# Patient Record
Sex: Female | Born: 1940 | Hispanic: No
Health system: Southern US, Community
[De-identification: ages and names within clinical notes are randomized; demographics above are authoritative.]

## PROBLEM LIST (undated history)

## (undated) DIAGNOSIS — I10 Essential (primary) hypertension: Secondary | ICD-10-CM

## (undated) DIAGNOSIS — E119 Type 2 diabetes mellitus without complications: Secondary | ICD-10-CM

## (undated) DIAGNOSIS — K56609 Unspecified intestinal obstruction, unspecified as to partial versus complete obstruction: Secondary | ICD-10-CM

## (undated) HISTORY — PX: ABDOMINAL SURGERY: SHX537

---

## 2018-07-18 ENCOUNTER — Emergency Department (HOSPITAL_BASED_OUTPATIENT_CLINIC_OR_DEPARTMENT_OTHER): Payer: Self-pay

## 2018-07-18 ENCOUNTER — Inpatient Hospital Stay (HOSPITAL_BASED_OUTPATIENT_CLINIC_OR_DEPARTMENT_OTHER): Payer: Self-pay

## 2018-07-18 ENCOUNTER — Other Ambulatory Visit: Payer: Self-pay

## 2018-07-18 ENCOUNTER — Inpatient Hospital Stay (HOSPITAL_COMMUNITY): Payer: Self-pay

## 2018-07-18 ENCOUNTER — Inpatient Hospital Stay (HOSPITAL_BASED_OUTPATIENT_CLINIC_OR_DEPARTMENT_OTHER)
Admission: EM | Admit: 2018-07-18 | Discharge: 2018-07-23 | DRG: 390 | Disposition: A | Discharge status: Home / Self Care | Payer: Self-pay | Attending: Internal Medicine | Admitting: Internal Medicine

## 2018-07-18 ENCOUNTER — Encounter (HOSPITAL_BASED_OUTPATIENT_CLINIC_OR_DEPARTMENT_OTHER): Payer: Self-pay

## 2018-07-18 DIAGNOSIS — H04123 Dry eye syndrome of bilateral lacrimal glands: Secondary | ICD-10-CM | POA: Diagnosis present

## 2018-07-18 DIAGNOSIS — J309 Allergic rhinitis, unspecified: Secondary | ICD-10-CM | POA: Diagnosis present

## 2018-07-18 DIAGNOSIS — E876 Hypokalemia: Secondary | ICD-10-CM | POA: Diagnosis present

## 2018-07-18 DIAGNOSIS — Z992 Dependence on renal dialysis: Secondary | ICD-10-CM

## 2018-07-18 DIAGNOSIS — I10 Essential (primary) hypertension: Secondary | ICD-10-CM | POA: Diagnosis present

## 2018-07-18 DIAGNOSIS — E119 Type 2 diabetes mellitus without complications: Secondary | ICD-10-CM | POA: Diagnosis present

## 2018-07-18 DIAGNOSIS — Z7722 Contact with and (suspected) exposure to environmental tobacco smoke (acute) (chronic): Secondary | ICD-10-CM | POA: Diagnosis present

## 2018-07-18 DIAGNOSIS — Z23 Encounter for immunization: Secondary | ICD-10-CM

## 2018-07-18 DIAGNOSIS — Z0189 Encounter for other specified special examinations: Secondary | ICD-10-CM

## 2018-07-18 DIAGNOSIS — K56609 Unspecified intestinal obstruction, unspecified as to partial versus complete obstruction: Principal | ICD-10-CM | POA: Diagnosis present

## 2018-07-18 DIAGNOSIS — Z79899 Other long term (current) drug therapy: Secondary | ICD-10-CM

## 2018-07-18 DIAGNOSIS — E1122 Type 2 diabetes mellitus with diabetic chronic kidney disease: Secondary | ICD-10-CM | POA: Diagnosis present

## 2018-07-18 DIAGNOSIS — E869 Volume depletion, unspecified: Secondary | ICD-10-CM | POA: Diagnosis present

## 2018-07-18 DIAGNOSIS — N186 End stage renal disease: Secondary | ICD-10-CM

## 2018-07-18 HISTORY — DX: Type 2 diabetes mellitus without complications: E11.9

## 2018-07-18 HISTORY — DX: Unspecified intestinal obstruction, unspecified as to partial versus complete obstruction: K56.609

## 2018-07-18 HISTORY — DX: Essential (primary) hypertension: I10

## 2018-07-18 LAB — CBC WITH DIFFERENTIAL/PLATELET
Abs Immature Granulocytes: 0.02 10*3/uL (ref 0.00–0.07)
Basophils Absolute: 0 10*3/uL (ref 0.0–0.1)
Basophils Relative: 0 %
Eosinophils Absolute: 0.1 10*3/uL (ref 0.0–0.5)
Eosinophils Relative: 1 %
HCT: 42.6 % (ref 36.0–46.0)
Hemoglobin: 13.9 g/dL (ref 12.0–15.0)
Immature Granulocytes: 0 %
Lymphocytes Relative: 16 %
Lymphs Abs: 1.3 10*3/uL (ref 0.7–4.0)
MCH: 28.4 pg (ref 26.0–34.0)
MCHC: 32.6 g/dL (ref 30.0–36.0)
MCV: 86.9 fL (ref 80.0–100.0)
Monocytes Absolute: 0.7 10*3/uL (ref 0.1–1.0)
Monocytes Relative: 9 %
Neutro Abs: 5.9 10*3/uL (ref 1.7–7.7)
Neutrophils Relative %: 74 %
Platelets: 235 10*3/uL (ref 150–400)
RBC: 4.9 MIL/uL (ref 3.87–5.11)
RDW: 13 % (ref 11.5–15.5)
WBC: 8 10*3/uL (ref 4.0–10.5)
nRBC: 0 % (ref 0.0–0.2)

## 2018-07-18 LAB — COMPREHENSIVE METABOLIC PANEL
ALT: 14 U/L (ref 0–44)
AST: 19 U/L (ref 15–41)
Albumin: 4 g/dL (ref 3.5–5.0)
Alkaline Phosphatase: 53 U/L (ref 38–126)
Anion gap: 12 (ref 5–15)
BUN: 21 mg/dL (ref 8–23)
CO2: 27 mmol/L (ref 22–32)
Calcium: 9.2 mg/dL (ref 8.9–10.3)
Chloride: 95 mmol/L — ABNORMAL LOW (ref 98–111)
Creatinine, Ser: 0.7 mg/dL (ref 0.44–1.00)
GFR calc Af Amer: >60 mL/min (ref >60)
GFR calc non Af Amer: >60 mL/min (ref >60)
Glucose, Bld: 148 mg/dL — ABNORMAL HIGH (ref 70–99)
Potassium: 3.7 mmol/L (ref 3.5–5.1)
Sodium: 134 mmol/L — ABNORMAL LOW (ref 135–145)
Total Bilirubin: 1.5 mg/dL — ABNORMAL HIGH (ref 0.3–1.2)
Total Protein: 7.4 g/dL (ref 6.5–8.1)

## 2018-07-18 LAB — CBC
HCT: 42.9 % (ref 36.0–46.0)
Hemoglobin: 13.7 g/dL (ref 12.0–15.0)
MCH: 27.3 pg (ref 26.0–34.0)
MCHC: 31.9 g/dL (ref 30.0–36.0)
MCV: 85.6 fL (ref 80.0–100.0)
Platelets: 233 10*3/uL (ref 150–400)
RBC: 5.01 MIL/uL (ref 3.87–5.11)
RDW: 13 % (ref 11.5–15.5)
WBC: 7.3 10*3/uL (ref 4.0–10.5)
nRBC: 0 % (ref 0.0–0.2)

## 2018-07-18 LAB — URINALYSIS, MICROSCOPIC (REFLEX)

## 2018-07-18 LAB — URINALYSIS, ROUTINE W REFLEX MICROSCOPIC
Bilirubin Urine: NEGATIVE
Glucose, UA: NEGATIVE mg/dL
Ketones, ur: 15 mg/dL — AB
Nitrite: NEGATIVE
Protein, ur: NEGATIVE mg/dL
Specific Gravity, Urine: <1.005 — ABNORMAL LOW (ref 1.005–1.030)
pH: 6 (ref 5.0–8.0)

## 2018-07-18 LAB — CREATININE, SERUM
Creatinine, Ser: 0.77 mg/dL (ref 0.44–1.00)
GFR calc Af Amer: >60 mL/min (ref >60)
GFR calc non Af Amer: >60 mL/min (ref >60)

## 2018-07-18 LAB — LIPASE, BLOOD: Lipase: 21 U/L (ref 11–51)

## 2018-07-18 MED ORDER — CLONIDINE HCL 0.1 MG/24HR TD PTWK
0.1000 mg | MEDICATED_PATCH | TRANSDERMAL | Status: DC
  Administered 2018-07-18: 0.1 mg via TRANSDERMAL
  Filled 2018-07-18: qty 1

## 2018-07-18 MED ORDER — HEPARIN SODIUM (PORCINE) 5000 UNIT/ML IJ SOLN
5000.0000 [IU] | Freq: Three times a day (TID) | INTRAMUSCULAR | Status: DC
  Administered 2018-07-18 – 2018-07-23 (×14): 5000 [IU] via SUBCUTANEOUS
  Filled 2018-07-18 (×14): qty 1

## 2018-07-18 MED ORDER — SODIUM CHLORIDE 0.9 % IV BOLUS
500.0000 mL | Freq: Once | INTRAVENOUS | Status: AC
  Administered 2018-07-18: 500 mL via INTRAVENOUS

## 2018-07-18 MED ORDER — METOPROLOL TARTRATE 5 MG/5ML IV SOLN: 5.0000 mg | Freq: Four times a day (QID) | INTRAVENOUS | Status: DC | PRN

## 2018-07-18 MED ORDER — LIDOCAINE HCL URETHRAL/MUCOSAL 2 % EX GEL
1.0000 "application " | Freq: Once | CUTANEOUS | Status: AC
  Administered 2018-07-18: 13:00:00 1

## 2018-07-18 MED ORDER — LIDOCAINE HCL URETHRAL/MUCOSAL 2 % EX GEL
CUTANEOUS | Status: AC
  Administered 2018-07-18: 1
  Filled 2018-07-18: qty 20

## 2018-07-18 MED ORDER — DIPHENHYDRAMINE HCL 50 MG/ML IJ SOLN
12.5000 mg | Freq: Once | INTRAMUSCULAR | Status: AC
  Administered 2018-07-18: 12.5 mg via INTRAVENOUS
  Filled 2018-07-18: qty 1

## 2018-07-18 MED ORDER — IOHEXOL 300 MG/ML  SOLN
100.0000 mL | Freq: Once | INTRAMUSCULAR | Status: AC | PRN
  Administered 2018-07-18: 100 mL via INTRAVENOUS

## 2018-07-18 MED ORDER — DIATRIZOATE MEGLUMINE & SODIUM 66-10 % PO SOLN
90.0000 mL | Freq: Once | ORAL | Status: AC
  Administered 2018-07-18: 90 mL via NASOGASTRIC
  Filled 2018-07-18: qty 90

## 2018-07-18 NOTE — H&P (Signed)
History and Physical  Erin Burke [facility]:[id number] DOB: [birth year] DOA: 07/18/2018  Referring physician: ER physician PCP: Patient, No Pcp Per  Outpatient Specialists:    Patient coming from: [facility] to the ER at [place] Complaint: Nausea, vomiting and abdominal pain.    HPI: Patient is a 78 year old female past medical history significant for diabetes mellitus, hypertension and prior history of small bowel obstruction about 2 years ago for which the patient underwent surgery.  Patient presents with abdominal pain that started 3 days ago, gradually worsening, with associated nausea and vomiting.  Last bowel movement was 4 days ago, and no flatus.  CT scan of the abdomen and pelvis done with contrast revealed small bowel obstruction with a transition point in the ventral abdomen.  No headache, no neck pain, no chest pain, no shortness of breath, no fevers chills, no urinary symptoms and no joint pain.  Patient be admitted for further assessment and management.  ED Course: Patient was seen in the ER, as CT revealed likely small bowel obstruction.  NG to low suction.  Surgical team was consulted by the ER team.  Pertinent labs: Chemistry reveals sodium of 134, potassium of 3.7, chloride 95, CO2 27, BUN of 21 with creatinine of 0.7.  CBC reveals WBC of 7.3, hemoglobin of 13.7, hematocrit of 40.9, MCV of 85.0 platelet count of 233.  Imaging: independently reviewed.   Review of Systems: Negative for fever, visual changes, sore throat, rash, new muscle aches, chest pain, SOB, dysuria.  Past Medical History:  Diagnosis Date  . Diabetes mellitus without complication ([place])   . Hypertension   . Small bowel obstruction [facility])     Past Surgical History:  Procedure Laterality Date  . ABDOMINAL SURGERY       reports that she is a non-smoker but has been exposed to tobacco smoke. She has never used smokeless tobacco. She reports that she does not drink alcohol or use drugs.   Allergies no known allergies  No family history on file.   Prior to Admission medications   Medication Sig Start Date End Date Taking? Authorizing Provider  atenolol (TENORMIN) 50 MG tablet Take 25 mg by mouth.   Yes [provider]  NON FORMULARY Take 50 mg by mouth 1 day or 1 dose. vildagliptin anti-hyperglycemic agent   Yes [provider]    Physical Exam: Vitals:   07/18/18 1600 07/18/18 1630 07/18/18 1700 07/18/18 1828  BP: (!) 143/68 113/87 132/63 (!) 151/73  Pulse: 96 92 90 98  Resp:   16 18  Temp:    (!) 97.5 F (36.4 C)  TempSrc:    Oral  SpO2: 95% 95% 93% 93%  Weight:    73.6 kg  Height:    5\' 1"  (1.549 m)     Constitutional:  . Appears calm and comfortable.  NG tube in situ. Eyes:  . No pallor. No jaundice.  ENMT:  . external ears, nose appear normal Neck:  . Neck is supple. No JVD Respiratory:  . CTA bilaterally, no w/r/r.  . Respiratory effort normal. No retractions or accessory muscle use Cardiovascular:  . S1S2 . No LE extremity edema   Abdomen:  . Abdomen is soft and non tender. Organs are difficult to assess. Neurologic:  . Awake and alert. . Moves all limbs.  Wt Readings from Last 3 Encounters:  07/18/18 73.6 kg    I have personally reviewed following labs and imaging studies  Labs on Admission:  CBC:  Recent Labs  Lab 07/18/18 1025  WBC 8.0  NEUTROABS 5.9  HGB 13.9  HCT 42.6  MCV 86.9  PLT 563   Basic Metabolic Panel: Recent Labs  Lab 07/18/18 1025  NA 134*  K 3.7  CL 95*  CO2 27  GLUCOSE 148*  BUN 21  CREATININE 0.70  CALCIUM 9.2   Liver Function Tests: Recent Labs  Lab 07/18/18 1025  AST 19  ALT 14  ALKPHOS 53  BILITOT 1.5*  PROT 7.4  ALBUMIN 4.0   Recent Labs  Lab 07/18/18 1025  LIPASE 21   No results for input(s): AMMONIA in the last 168 hours. Coagulation Profile: No results for input(s): INR, PROTIME in the last 168 hours. Cardiac Enzymes: No results for input(s): CKTOTAL,  CKMB, CKMBINDEX, TROPONINI in the last 168 hours. BNP (last 3 results) No results for input(s): PROBNP in the last 8760 hours. HbA1C: No results for input(s): HGBA1C in the last 72 hours. CBG: No results for input(s): GLUCAP in the last 168 hours. Lipid Profile: No results for input(s): CHOL, HDL, LDLCALC, TRIG, CHOLHDL, LDLDIRECT in the last 72 hours. Thyroid Function Tests: No results for input(s): TSH, T4TOTAL, FREET4, T3FREE, THYROIDAB in the last 72 hours. Anemia Panel: No results for input(s): VITAMINB12, FOLATE, FERRITIN, TIBC, IRON, RETICCTPCT in the last 72 hours. Urine analysis:    Component Value Date/Time   COLORURINE YELLOW 07/18/2018 1248   APPEARANCEUR CLEAR 07/18/2018 1248   LABSPEC <1.005 (L) 07/18/2018 1248   PHURINE 6.0 07/18/2018 1248   GLUCOSEU NEGATIVE 07/18/2018 1248   HGBUR TRACE (A) 07/18/2018 1248   BILIRUBINUR NEGATIVE 07/18/2018 1248   KETONESUR 15 (A) 07/18/2018 1248   PROTEINUR NEGATIVE 07/18/2018 1248   NITRITE NEGATIVE 07/18/2018 1248   LEUKOCYTESUR TRACE (A) 07/18/2018 1248   Sepsis Labs: @LABRCNTIP (procalcitonin:4,lacticidven:4) )No results found for this or any previous visit (from the past 240 hour(s)).    Radiological Exams on Admission: Ct Abdomen Pelvis W Contrast  Result Date: 07/18/2018 CLINICAL DATA:  Bowel obstruction, high-grade. EXAM: CT ABDOMEN AND PELVIS WITH CONTRAST TECHNIQUE: Multidetector CT imaging of the abdomen and pelvis was performed using the standard protocol following bolus administration of intravenous contrast. CONTRAST:  150mL OMNIPAQUE IOHEXOL 300 MG/ML  SOLN COMPARISON:  None. FINDINGS: Lower chest: Mild dependent atelectasis is present at the lung bases bilaterally. The heart size is normal. No significant pleural or pericardial effusion is present. Coronary artery calcifications are present. Hepatobiliary: Mild intrahepatic biliary dilation is present. A cyst is present posteriorly in the right lobe of the liver  measuring 10 mm. Common bile duct is within normal limits. No other parenchymal lesions are present. The gallbladder is normal. Pancreas: Unremarkable. No pancreatic ductal dilatation or surrounding inflammatory changes. Spleen: Normal in size without focal abnormality. Adrenals/Urinary Tract: Adrenal glands are normal bilaterally. Kidneys and ureters are within normal limits. The urinary bladder is normal. Stomach/Bowel: The stomach and duodenum are within normal limits. The proximal small bowel is moderately dilated. There is a transition point just left of midline in the ventral abdomen. No focal mass lesion is present. There is fecalized material at the transition point. There is also some free fluid. No free air is present. The more distal small bowel is decompressed. The ascending and transverse colon are normal. The descending and sigmoid colon are normal. Vascular/Lymphatic: Atherosclerotic calcifications are present in the aorta and branch vessels without aneurysm. No significant adenopathy is present. Reproductive: Uterus and bilateral adnexa are unremarkable. Other: No other free fluid or free  air is present. There is no ventral hernia. Musculoskeletal: Grade 1 anterolisthesis is present at L4-5. AP alignment is otherwise anatomic. A vacuum disc is present at L5-S1. IMPRESSION: 1. Small bowel obstruction with a transition point in the ventral abdomen. No mass lesion is present. This may be due to adhesions. 2. Fluid adjacent to the transition point is likely inflammatory. 3. Mild intrahepatic biliary dilation without an obstructing lesion. This may be within normal limits. 4. Degenerative changes in the lower lumbar spine. Electronically Signed   By: San Morelle M.D.   On: 07/18/2018 12:13   Dg Chest Portable 1 View  Result Date: 07/18/2018 CLINICAL DATA:  Nasogastric placement for small-bowel obstruction. EXAM: PORTABLE CHEST 1 VIEW COMPARISON:  CT same day FINDINGS: Nasogastric tube enters  the stomach with its tip in the fundus. Dilated fluid and air-filled loops of small bowel are partially depicted. Active cardiopulmonary disease. Heart size is normal. Lungs are clear. IMPRESSION: Nasogastric tube enters the stomach with its tip in the fundus. Electronically Signed   By: Nelson Chimes M.D.   On: 07/18/2018 13:41    Active Problems:   SBO (small bowel obstruction) (HCC)   Assessment/Plan Small bowel obstruction: NG tube is placed to low suction. Supportive care for now. Surgery team is following.  Nausea and vomiting: Likely secondary to above. Manage expectantly.  Volume depletion: Continue IV fluids Supportive care  Hypertension: Continue to optimize.  Diabetes mellitus: Continue to optimize.  DVT prophylaxis:Subacute heparin Code Status: Full code Family Communication:  Disposition Plan: Home eventually Consults called: ER team has already consulted surgical history Admission status: Inpatient  Time spent: 45 minutes.  Dana Allan, MD  [facility] Pager #: [phone] 7PM-7AM contact night coverage as above  07/18/2018, 6:48 PM

## 2018-07-18 NOTE — ED Notes (Signed)
Bedside CXR to confirm NGT plct. NG placed to low intermittent suction, green biliary fluid out.

## 2018-07-18 NOTE — ED Triage Notes (Addendum)
Pt afebrile, from [place], came to [place] in Dec to visit family though unable to return home at this time.   Pt c/o abd pain 3 days ago. Last BM 4 days, "very little". No flatus x3 days.  Nausea yesterday x1, then again overnight. Pt induced vomiting with only liquid emesis. Last solid food Wed morning, few fluids past few days.  No pain/nausea meds given at home.  Yesterday pt given dulcolax, Miralax, suppository, and enema all at home with no results.   Occasional constipation, hx obstruction and surgical intervention (back home in [place]).

## 2018-07-18 NOTE — ED Notes (Signed)
First contact with patient; self and role introduced.  Daughter at bedside with pt. Pt placed on BP monitor for ongoing assessment.   Previous documentation noted. Patient agrees. MD in room to assess pt using audio interpretor. Will await orders.   Bed is in low, locked position. Call light within reach.  All needs and concerns addressed prior to leaving room.

## 2018-07-18 NOTE — ED Provider Notes (Signed)
Emergency Department Provider Note   I have reviewed the triage vital signs and the nursing notes.   HISTORY  Chief Complaint Abdominal Pain and Nausea   HPI Jacaria Colburn is a 78 y.o. female from [place] with [place] of prior SBO with surgery in [place], HTN, and DM presents to the emergency department with abdominal pain with nausea vomiting and decreased stool/flatus output.  Symptoms have been worsening over the past 4 days.  Patient is here visiting her daughter and has been unable to return to [place] because of COVID-19.  The patient states she has had worsening symptoms over the past 4 days.  She has nausea with eating any foods.  She has been making herself vomit to help her feel better temporarily.  She denies any blood in the vomitus.  4 days ago the patient felt some constipation type symptoms.  Her daughter tried an enema with no relief.  She had a lot of flatus several days ago but that has decreased and has not passed any stool or gas in the past 24 hours.  She denies any fevers.  No respiratory symptoms.  No past medical history on file.  Patient Active Problem List   Diagnosis Date Noted  . SBO (small bowel obstruction) ([place]) 07/18/2018   Allergies Patient has no known allergies.  No family history on file.  Social History Social History   Tobacco Use  . Smoking status: Not on file  Substance Use Topics  . Alcohol use: Not on file  . Drug use: Not on file    Review of Systems  Constitutional: No fever/chills Eyes: No visual changes. ENT: No sore throat. Cardiovascular: Denies chest pain. Respiratory: Denies shortness of breath. Gastrointestinal: Positive abdominal pain. Positive nausea and vomiting.  No diarrhea. Positive constipation. Genitourinary: Negative for dysuria. Musculoskeletal: Negative for back pain. Skin: Negative for rash. Neurological: Negative for headaches, focal weakness or numbness.  10-point ROS otherwise negative.   ____________________________________________   PHYSICAL EXAM:  VITAL SIGNS: ED Triage Vitals  Enc Vitals Group     BP 07/18/18 0955 134/76     Pulse Rate 07/18/18 0955 97     Resp --      Temp 07/18/18 0955 98.1 F (36.7 C)     Temp Source 07/18/18 0955 Oral     SpO2 07/18/18 0955 97 %     Weight 07/18/18 0957 163 lb (73.9 kg)     Height 07/18/18 0957 5\' 1"  (1.549 m)     Pain Score 07/18/18 0956 6   Constitutional: Alert and oriented. Well appearing and in no acute distress. Eyes: Conjunctivae are normal. Head: Atraumatic. Nose: No congestion/rhinnorhea. Mouth/Throat: Mucous membranes are moist.  Neck: No stridor.  Cardiovascular: Normal rate, regular rhythm. Good peripheral circulation. Grossly normal heart sounds.   Respiratory: Normal respiratory effort.  No retractions. Lungs CTAB. Gastrointestinal: Soft with mild diffuse tenderness. No focal tenderness, rebound, or guarding. Mild distention.  Musculoskeletal: No lower extremity tenderness nor edema. No gross deformities of extremities. Neurologic:  Normal speech and language. No gross focal neurologic deficits are appreciated.  Skin:  Skin is warm, dry and intact. No rash noted.  ____________________________________________   LABS (all labs ordered are listed, but only abnormal results are displayed)  Labs Reviewed  COMPREHENSIVE METABOLIC PANEL - Abnormal; Notable for the following components:      Result Value   Sodium 134 (*)    Chloride 95 (*)    Glucose, Bld 148 (*)    Total  Bilirubin 1.5 (*)    All other components within normal limits  URINE CULTURE  LIPASE, BLOOD  CBC WITH DIFFERENTIAL/PLATELET  URINALYSIS, ROUTINE W REFLEX MICROSCOPIC   ____________________________________________  RADIOLOGY  Ct Abdomen Pelvis W Contrast  Result Date: 07/18/2018 CLINICAL DATA:  Bowel obstruction, high-grade. EXAM: CT ABDOMEN AND PELVIS WITH CONTRAST TECHNIQUE: Multidetector CT imaging of the abdomen and pelvis  was performed using the standard protocol following bolus administration of intravenous contrast. CONTRAST:  18mL OMNIPAQUE IOHEXOL 300 MG/ML  SOLN COMPARISON:  None. FINDINGS: Lower chest: Mild dependent atelectasis is present at the lung bases bilaterally. The heart size is normal. No significant pleural or pericardial effusion is present. Coronary artery calcifications are present. Hepatobiliary: Mild intrahepatic biliary dilation is present. A cyst is present posteriorly in the right lobe of the liver measuring 10 mm. Common bile duct is within normal limits. No other parenchymal lesions are present. The gallbladder is normal. Pancreas: Unremarkable. No pancreatic ductal dilatation or surrounding inflammatory changes. Spleen: Normal in size without focal abnormality. Adrenals/Urinary Tract: Adrenal glands are normal bilaterally. Kidneys and ureters are within normal limits. The urinary bladder is normal. Stomach/Bowel: The stomach and duodenum are within normal limits. The proximal small bowel is moderately dilated. There is a transition point just left of midline in the ventral abdomen. No focal mass lesion is present. There is fecalized material at the transition point. There is also some free fluid. No free air is present. The more distal small bowel is decompressed. The ascending and transverse colon are normal. The descending and sigmoid colon are normal. Vascular/Lymphatic: Atherosclerotic calcifications are present in the aorta and branch vessels without aneurysm. No significant adenopathy is present. Reproductive: Uterus and bilateral adnexa are unremarkable. Other: No other free fluid or free air is present. There is no ventral hernia. Musculoskeletal: Grade 1 anterolisthesis is present at L4-5. AP alignment is otherwise anatomic. A vacuum disc is present at L5-S1. IMPRESSION: 1. Small bowel obstruction with a transition point in the ventral abdomen. No mass lesion is present. This may be due to  adhesions. 2. Fluid adjacent to the transition point is likely inflammatory. 3. Mild intrahepatic biliary dilation without an obstructing lesion. This may be within normal limits. 4. Degenerative changes in the lower lumbar spine. Electronically Signed   By: San Morelle M.D.   On: 07/18/2018 12:13    ____________________________________________   PROCEDURES  Procedure(s) performed:   Procedures  None  ____________________________________________   INITIAL IMPRESSION / ASSESSMENT AND PLAN / ED COURSE  Pertinent labs & imaging results that were available during my care of the patient were reviewed by me and considered in my medical decision making (see chart for details).   Patient presents to the emergency department for evaluation of abdominal pain with mild distention, nausea, vomiting, constipation.  She has a history of bowel obstruction and my clinical suspicion for this is elevated at this time.  No fever.  No respiratory symptoms.  Patient not requesting any pain or nausea medicine at this time.  Vital signs are unremarkable.  Low suspicion for bowel ischemia clinically.  Plan for screening labs, CT, reassess. No symptoms to suspect COVID infection.   CT imaging reviewed which shows evidence of small bowel obstruction with transition point in the ventral abdomen.  No other complicating factors on CT.  Lab work reviewed.  Discussed the results with the patient and her daughter at bedside.  NG tube placed intermittent suction.  Discussed the case with Dr. Rosendo Gros with general  surgery.  He plans to consult on the patient and requests hospitalist admission.   Discussed patient's case with Hospitalist, Dr. Derryl Harbor to request admission. Patient and family (if present) updated with plan. Care transferred to Hospitalist service.  I was asked by the hospitalist to place temporary admission orders.  Agrees with inpatient, MedSurg bed.  Orders placed.   I reviewed all nursing notes,  vitals, pertinent old records, EKGs, labs, imaging (as available).  ____________________________________________  FINAL CLINICAL IMPRESSION(S) / ED DIAGNOSES  Final diagnoses:  SBO (small bowel obstruction) (HCC)     MEDICATIONS GIVEN DURING THIS VISIT:  Medications  sodium chloride 0.9 % bolus 500 mL (0 mLs Intravenous Stopped 07/18/18 1312)  iohexol (OMNIPAQUE) 300 MG/ML solution 100 mL (100 mLs Intravenous Contrast Given 07/18/18 1142)  lidocaine (XYLOCAINE) 2 % jelly 1 application (1 application Other Given 07/18/18 1251)    Note:  This document was prepared using Dragon voice recognition software and may include unintentional dictation errors.  Nanda Quinton, MD Emergency Medicine    Jasani Lengel, [facility], MD 07/18/18 [phone]

## 2018-07-18 NOTE — Consult Note (Signed)
Reason for Consult: Small bowel obstruction Referring Physician: Marthenia Rolling MD   Erin Burke is an 78 y.o. female.  HPI: Asked see the patient at the request of Dr.ogbata due to small bowel obstruction.  The patient has a 4-day history of nausea, vomiting and constipation.  She has had pain throughout her abdomen during those 4 days but today it is gotten worse.  She has had flatus up until yesterday.  CT scan shows high-grade mid to distal small bowel obstruction.  She has a history of small bowel obstruction back in [place] where she is from her.  Her daughters at the bedside to help communicate with her.  She feels better with the NG tube in for now.  There is been no blood in her emesis nor stool.  Past Medical History:  Diagnosis Date  . Diabetes mellitus without complication ([place])   . Hypertension   . Small bowel obstruction [facility])     Past Surgical History:  Procedure Laterality Date  . ABDOMINAL SURGERY      No family history on file.  Social History:  reports that she is a non-smoker but has been exposed to tobacco smoke. She has never used smokeless tobacco. She reports that she does not drink alcohol or use drugs.  Allergies: Allergies no known allergies  Medications: I have reviewed the patient's current medications.  Results for orders placed or performed during the hospital encounter of 07/18/18 (from the past 48 hour(s))  Comprehensive metabolic panel     Status: Abnormal   Collection Time: 07/18/18 10:25 AM  Result Value Ref Range   Sodium 134 (L) 135 - 145 mmol/L   Potassium 3.7 3.5 - 5.1 mmol/L   Chloride 95 (L) 98 - 111 mmol/L   CO2 27 22 - 32 mmol/L   Glucose, Bld 148 (H) 70 - 99 mg/dL   BUN 21 8 - 23 mg/dL   Creatinine, Ser 0.70 0.44 - 1.00 mg/dL   Calcium 9.2 8.9 - 10.3 mg/dL   Total Protein 7.4 6.5 - 8.1 g/dL   Albumin 4.0 3.5 - 5.0 g/dL   AST 19 15 - 41 U/L   ALT 14 0 - 44 U/L   Alkaline Phosphatase 53 38 - 126 U/L   Total Bilirubin 1.5 (H) 0.3 -  1.2 mg/dL   GFR calc non Af Amer >60 >60 mL/min   GFR calc Af Amer >60 >60 mL/min   Anion gap 12 5 - 15    Comment: Performed at [facility], [address] [place], [place] [address]  Lipase, blood     Status: None   Collection Time: 07/18/18 10:25 AM  Result Value Ref Range   Lipase 21 11 - 51 U/L    Comment: Performed at [facility], [address] [place], [place] [address]  CBC with Differential     Status: None   Collection Time: 07/18/18 10:25 AM  Result Value Ref Range   WBC 8.0 4.0 - 10.5 K/uL   RBC 4.90 3.87 - 5.11 MIL/uL   Hemoglobin 13.9 12.0 - 15.0 g/dL   HCT 42.6 36.0 - 46.0 %   MCV 86.9 80.0 - 100.0 fL   MCH 28.4 26.0 - 34.0 pg   MCHC 32.6 30.0 - 36.0 g/dL   RDW 13.0 11.5 - 15.5 %   Platelets 235 150 - 400 K/uL   nRBC 0.0 0.0 - 0.2 %   Neutrophils Relative % 74 %   Neutro Abs  5.9 1.7 - 7.7 K/uL   Lymphocytes Relative 16 %   Lymphs Abs 1.3 0.7 - 4.0 K/uL   Monocytes Relative 9 %   Monocytes Absolute 0.7 0.1 - 1.0 K/uL   Eosinophils Relative 1 %   Eosinophils Absolute 0.1 0.0 - 0.5 K/uL   Basophils Relative 0 %   Basophils Absolute 0.0 0.0 - 0.1 K/uL   Immature Granulocytes 0 %   Abs Immature Granulocytes 0.02 0.00 - 0.07 K/uL    Comment: Performed at [facility], [address] [place], [place] [address]  Urinalysis, Routine w reflex microscopic     Status: Abnormal   Collection Time: 07/18/18 12:48 PM  Result Value Ref Range   Color, Urine YELLOW YELLOW   APPearance CLEAR CLEAR   Specific Gravity, Urine <1.005 (L) 1.005 - 1.030   pH 6.0 5.0 - 8.0   Glucose, UA NEGATIVE NEGATIVE mg/dL   Hgb urine dipstick TRACE (A) NEGATIVE   Bilirubin Urine NEGATIVE NEGATIVE   Ketones, ur 15 (A) NEGATIVE mg/dL   Protein, ur NEGATIVE NEGATIVE mg/dL   Nitrite NEGATIVE NEGATIVE   Leukocytes,Ua TRACE (A) NEGATIVE    Comment: Performed at [facility], [address] [place], [place] [address]  Urinalysis, Microscopic  (reflex)     Status: Abnormal   Collection Time: 07/18/18 12:48 PM  Result Value Ref Range   RBC / HPF 0-5 0 - 5 RBC/hpf   WBC, UA 0-5 0 - 5 WBC/hpf   Bacteria, UA MANY (A) NONE SEEN   Squamous Epithelial / LPF 0-5 0 - 5    Comment: Performed at [facility], [address] [place], [place] [address]    Ct Abdomen Pelvis W Contrast  Result Date: 07/18/2018 CLINICAL DATA:  Bowel obstruction, high-grade. EXAM: CT ABDOMEN AND PELVIS WITH CONTRAST TECHNIQUE: Multidetector CT imaging of the abdomen and pelvis was performed using the standard protocol following bolus administration of intravenous contrast. CONTRAST:  186mL OMNIPAQUE IOHEXOL 300 MG/ML  SOLN COMPARISON:  None. FINDINGS: Lower chest: Mild dependent atelectasis is present at the lung bases bilaterally. The heart size is normal. No significant pleural or pericardial effusion is present. Coronary artery calcifications are present. Hepatobiliary: Mild intrahepatic biliary dilation is present. A cyst is present posteriorly in the right lobe of the liver measuring 10 mm. Common bile duct is within normal limits. No other parenchymal lesions are present. The gallbladder is normal. Pancreas: Unremarkable. No pancreatic ductal dilatation or surrounding inflammatory changes. Spleen: Normal in size without focal abnormality. Adrenals/Urinary Tract: Adrenal glands are normal bilaterally. Kidneys and ureters are within normal limits. The urinary bladder is normal. Stomach/Bowel: The stomach and duodenum are within normal limits. The proximal small bowel is moderately dilated. There is a transition point just left of midline in the ventral abdomen. No focal mass lesion is present. There is fecalized material at the transition point. There is also some free fluid. No free air is present. The more distal small bowel is decompressed. The ascending and transverse colon are normal. The descending and sigmoid colon are normal. Vascular/Lymphatic:  Atherosclerotic calcifications are present in the aorta and branch vessels without aneurysm. No significant adenopathy is present. Reproductive: Uterus and bilateral adnexa are unremarkable. Other: No other free fluid or free air is present. There is no ventral hernia. Musculoskeletal: Grade 1 anterolisthesis is present at L4-5. AP alignment is otherwise anatomic. A vacuum disc is present at L5-S1. IMPRESSION: 1. Small bowel obstruction with  a transition point in the ventral abdomen. No mass lesion is present. This may be due to adhesions. 2. Fluid adjacent to the transition point is likely inflammatory. 3. Mild intrahepatic biliary dilation without an obstructing lesion. This may be within normal limits. 4. Degenerative changes in the lower lumbar spine. Electronically Signed   By: San Morelle M.D.   On: 07/18/2018 12:13   Dg Chest Portable 1 View  Result Date: 07/18/2018 CLINICAL DATA:  Nasogastric placement for small-bowel obstruction. EXAM: PORTABLE CHEST 1 VIEW COMPARISON:  CT same day FINDINGS: Nasogastric tube enters the stomach with its tip in the fundus. Dilated fluid and air-filled loops of small bowel are partially depicted. Active cardiopulmonary disease. Heart size is normal. Lungs are clear. IMPRESSION: Nasogastric tube enters the stomach with its tip in the fundus. Electronically Signed   By: Nelson Chimes M.D.   On: 07/18/2018 13:41    Review of Systems  All other systems reviewed and are negative.  Blood pressure (!) 151/73, pulse 98, temperature (!) 97.5 F (36.4 C), temperature source Oral, resp. rate 18, height 5\' 1"  (1.549 m), weight 73.6 kg, SpO2 93 %. Physical Exam  Constitutional: She is oriented to person, place, and time. She appears well-developed and well-nourished.  HENT:  Head: Normocephalic.  NG tube in place  Eyes: Pupils are equal, round, and reactive to light. EOM are normal.  Neck: Normal range of motion. Neck supple.  Cardiovascular: Normal rate.   Respiratory: Effort normal.  GI: Soft. She exhibits no distension and no mass. There is no abdominal tenderness. There is no rebound and no guarding. No hernia.  Musculoskeletal: Normal range of motion.  Neurological: She is alert and oriented to person, place, and time.    Assessment/Plan: Small bowel obstruction  NG tube  Small bowel protocol  N.p.o.  Okay to ambulate  Repeat films in a.m.  Surgery follow  Turner Daniels 07/18/2018, 7:20 PM

## 2018-07-18 NOTE — Progress Notes (Signed)
Pt admitted to [facility] from [facility] via [place]. Pt alert. VS stable. Admissions paged for attending at this location. Daughter at bedside. Call bell within reach. Will await any new orders and continue to monitor.

## 2018-07-19 ENCOUNTER — Inpatient Hospital Stay (HOSPITAL_COMMUNITY): Payer: Self-pay

## 2018-07-19 LAB — GLUCOSE, CAPILLARY
Glucose-Capillary: 122 mg/dL — ABNORMAL HIGH (ref 70–99)
Glucose-Capillary: 103 mg/dL — ABNORMAL HIGH (ref 70–99)
Glucose-Capillary: 97 mg/dL (ref 70–99)
Glucose-Capillary: 87 mg/dL (ref 70–99)
Glucose-Capillary: 134 mg/dL — ABNORMAL HIGH (ref 70–99)

## 2018-07-19 LAB — CBC
HCT: 40.8 % (ref 36.0–46.0)
Hemoglobin: 13 g/dL (ref 12.0–15.0)
MCH: 27.3 pg (ref 26.0–34.0)
MCHC: 31.9 g/dL (ref 30.0–36.0)
MCV: 85.5 fL (ref 80.0–100.0)
Platelets: 232 10*3/uL (ref 150–400)
RBC: 4.77 MIL/uL (ref 3.87–5.11)
RDW: 13 % (ref 11.5–15.5)
WBC: 6.8 10*3/uL (ref 4.0–10.5)
nRBC: 0 % (ref 0.0–0.2)

## 2018-07-19 LAB — URINE CULTURE

## 2018-07-19 LAB — MAGNESIUM: Magnesium: 2.4 mg/dL (ref 1.7–2.4)

## 2018-07-19 LAB — BASIC METABOLIC PANEL
Anion gap: 12 (ref 5–15)
BUN: 18 mg/dL (ref 8–23)
CO2: 29 mmol/L (ref 22–32)
Calcium: 9.2 mg/dL (ref 8.9–10.3)
Chloride: 96 mmol/L — ABNORMAL LOW (ref 98–111)
Creatinine, Ser: 0.73 mg/dL (ref 0.44–1.00)
GFR calc Af Amer: >60 mL/min (ref >60)
GFR calc non Af Amer: >60 mL/min (ref >60)
Glucose, Bld: 110 mg/dL — ABNORMAL HIGH (ref 70–99)
Potassium: 3.4 mmol/L — ABNORMAL LOW (ref 3.5–5.1)
Sodium: 137 mmol/L (ref 135–145)

## 2018-07-19 MED ORDER — POTASSIUM CHLORIDE 2 MEQ/ML IV SOLN: INTRAVENOUS | Status: DC

## 2018-07-19 MED ORDER — ALUM & MAG HYDROXIDE-SIMETH 200-200-20 MG/5ML PO SUSP
30.0000 mL | Freq: Four times a day (QID) | ORAL | Status: AC | PRN
  Administered 2018-07-19 – 2018-07-20 (×2): 30 mL via ORAL
  Filled 2018-07-19 (×2): qty 30

## 2018-07-19 MED ORDER — INSULIN ASPART 100 UNIT/ML ~~LOC~~ SOLN
0.0000 [IU] | SUBCUTANEOUS | Status: DC
  Administered 2018-07-19 – 2018-07-23 (×11): 1 [IU] via SUBCUTANEOUS

## 2018-07-19 MED ORDER — DIPHENHYDRAMINE HCL 50 MG/ML IJ SOLN
12.5000 mg | Freq: Once | INTRAMUSCULAR | Status: AC
  Administered 2018-07-19: 12.5 mg via INTRAVENOUS
  Filled 2018-07-19: qty 1

## 2018-07-19 MED ORDER — POTASSIUM CHLORIDE 10 MEQ/100ML IV SOLN
10.0000 meq | INTRAVENOUS | Status: AC
  Administered 2018-07-19 (×4): 10 meq via INTRAVENOUS
  Filled 2018-07-19 (×4): qty 100

## 2018-07-19 MED ORDER — KCL IN DEXTROSE-NACL 20-5-0.9 MEQ/L-%-% IV SOLN
INTRAVENOUS | Status: AC
  Administered 2018-07-19: 17:00:00 via INTRAVENOUS
  Filled 2018-07-19 (×3): qty 1000

## 2018-07-19 NOTE — Progress Notes (Signed)
Subjective/Chief Complaint: Pt with no acute changes overnight NGT in place no n/v    Objective: Vital signs in last 24 hours: Temp:  [97.5 F (36.4 C)-98.1 F (36.7 C)] 97.6 F (36.4 C) (04/18 0859) Pulse Rate:  [88-106] 106 (04/18 0859) Resp:  [16-18] 16 (04/18 0552) BP: (113-151)/(62-87) 139/77 (04/18 0859) SpO2:  [90 %-99 %] 95 % (04/18 0859) Weight:  [73.6 kg-73.9 kg] 73.6 kg (04/17 1828) Last BM Date: 07/14/18  Intake/Output from previous day: 04/17 0701 - 04/18 0700 In: 500 [IV Piggyback:500] Out: 800 [Emesis/NG output:800] Intake/Output this shift: No intake/output data recorded.  Constitutional: No acute distress, conversant, appears states age. Eyes: Anicteric sclerae, moist conjunctiva, no lid lag Lungs: Clear to auscultation bilaterally, normal respiratory effort CV: regular rate and rhythm, no murmurs, no peripheral edema, pedal pulses 2+ GI: Soft, no masses or hepatosplenomegaly, non-tender to palpation, non distended Skin: No rashes, palpation reveals normal turgor Psychiatric: appropriate judgment and insight, oriented to person, place, and time   Lab Results:  Recent Labs    07/18/18 2028 07/19/18 0220  WBC 7.3 6.8  HGB 13.7 13.0  HCT 42.9 40.8  PLT 233 232   BMET Recent Labs    07/18/18 1025 07/18/18 2028 07/19/18 0220  NA 134*  --  137  K 3.7  --  3.4*  CL 95*  --  96*  CO2 27  --  29  GLUCOSE 148*  --  110*  BUN 21  --  18  CREATININE 0.70 0.77 0.73  CALCIUM 9.2  --  9.2   PT/INR No results for input(s): LABPROT, INR in the last 72 hours. ABG No results for input(s): PHART, HCO3 in the last 72 hours.  Invalid input(s): PCO2, PO2  Studies/Results: Ct Abdomen Pelvis W Contrast  Result Date: 07/18/2018 CLINICAL DATA:  Bowel obstruction, high-grade. EXAM: CT ABDOMEN AND PELVIS WITH CONTRAST TECHNIQUE: Multidetector CT imaging of the abdomen and pelvis was performed using the standard protocol following bolus administration of  intravenous contrast. CONTRAST:  116mL OMNIPAQUE IOHEXOL 300 MG/ML  SOLN COMPARISON:  None. FINDINGS: Lower chest: Mild dependent atelectasis is present at the lung bases bilaterally. The heart size is normal. No significant pleural or pericardial effusion is present. Coronary artery calcifications are present. Hepatobiliary: Mild intrahepatic biliary dilation is present. A cyst is present posteriorly in the right lobe of the liver measuring 10 mm. Common bile duct is within normal limits. No other parenchymal lesions are present. The gallbladder is normal. Pancreas: Unremarkable. No pancreatic ductal dilatation or surrounding inflammatory changes. Spleen: Normal in size without focal abnormality. Adrenals/Urinary Tract: Adrenal glands are normal bilaterally. Kidneys and ureters are within normal limits. The urinary bladder is normal. Stomach/Bowel: The stomach and duodenum are within normal limits. The proximal small bowel is moderately dilated. There is a transition point just left of midline in the ventral abdomen. No focal mass lesion is present. There is fecalized material at the transition point. There is also some free fluid. No free air is present. The more distal small bowel is decompressed. The ascending and transverse colon are normal. The descending and sigmoid colon are normal. Vascular/Lymphatic: Atherosclerotic calcifications are present in the aorta and branch vessels without aneurysm. No significant adenopathy is present. Reproductive: Uterus and bilateral adnexa are unremarkable. Other: No other free fluid or free air is present. There is no ventral hernia. Musculoskeletal: Grade 1 anterolisthesis is present at L4-5. AP alignment is otherwise anatomic. A vacuum disc is present at L5-S1. IMPRESSION:  1. Small bowel obstruction with a transition point in the ventral abdomen. No mass lesion is present. This may be due to adhesions. 2. Fluid adjacent to the transition point is likely inflammatory. 3.  Mild intrahepatic biliary dilation without an obstructing lesion. This may be within normal limits. 4. Degenerative changes in the lower lumbar spine. Electronically Signed   By: San Morelle M.D.   On: 07/18/2018 12:13   Dg Chest Portable 1 View  Result Date: 07/18/2018 CLINICAL DATA:  Nasogastric placement for small-bowel obstruction. EXAM: PORTABLE CHEST 1 VIEW COMPARISON:  CT same day FINDINGS: Nasogastric tube enters the stomach with its tip in the fundus. Dilated fluid and air-filled loops of small bowel are partially depicted. Active cardiopulmonary disease. Heart size is normal. Lungs are clear. IMPRESSION: Nasogastric tube enters the stomach with its tip in the fundus. Electronically Signed   By: Nelson Chimes M.D.   On: 07/18/2018 13:41   Dg Abd Portable 1v-small Bowel Obstruction Protocol-initial, 8 Hr Delay  Result Date: 07/19/2018 CLINICAL DATA:  Small bowel obstruction. EXAM: PORTABLE ABDOMEN - 1 VIEW COMPARISON:  07/18/2018 FINDINGS: An enteric tube terminates over the proximal stomach, unchanged. There is persistent moderate dilatation of multiple small bowel loops in the upper and mid abdomen, similar to the prior study. Small volume stool and gas are present in nondilated colon. Gas is present in the rectum. No gross intraperitoneal free air is identified on this supine study. No acute osseous abnormality is seen. IMPRESSION: Unchanged small bowel dilatation consistent with obstruction. Electronically Signed   By: Logan Bores M.D.   On: 07/19/2018 08:46   Dg Abd Portable 1v-small Bowel Protocol-position Verification  Result Date: 07/18/2018 CLINICAL DATA:  Check gastric catheter placement EXAM: PORTABLE ABDOMEN - 1 VIEW COMPARISON:  CT from earlier in the same day. FINDINGS: Gastric catheter is now seen within the stomach. Scattered small bowel dilatation is again noted. No free air is seen. IMPRESSION: Gastric catheter within the stomach. Electronically Signed   By: Inez Catalina M.D.   On: 07/18/2018 22:09    Anti-infectives: Anti-infectives (From admission, onward)   None      Assessment/Plan: 47 F w/ SBO  -con't NGT.  KUB reviewed and no contrast can be seen. -will cont to observe and repeat KUB in AM -no surgical plans at this time.   LOS: 1 day    Ralene Ok 07/19/2018

## 2018-07-19 NOTE — Plan of Care (Signed)
  Problem: Nutrition: Goal: Adequate nutrition will be maintained Outcome: Not Progressing  Patient NPO. IVF in place

## 2018-07-19 NOTE — Progress Notes (Signed)
PROGRESS NOTE   Erin Burke  OTL:[id number]    DOB: [birth year]    DOA: 07/18/2018  PCP: Patient, No Pcp Per   I have briefly reviewed patients previous medical records in [facility].  Brief Narrative:  78 year old [place] female, visiting daughter in [place] and stay prolonged due to travel restrictions from current COVID pandemic, PMH of DM 2, HTN, prior SBO for which she had surgery 2 years ago, presented to [facility] ED 4/17 due to 4 days history of abdominal pain, nausea and vomiting and found to have high-grade mid to distal small bowel obstruction on CT abdomen.  Treating conservatively with NPO, IV fluids and NG tube.  General surgery consulted.   Assessment & Plan:   Active Problems:   SBO (small bowel obstruction) ([place])   1. SBO: Possibly due to adhesions from prior laparotomy.  General surgery consulted.  Treating conservatively with NPO, NG tube, IV fluids.  Passing flatus.  No BM.  Repeat KUB 4/18 shows no change.  Continue management and closely monitor.  Hopefully will resolve with conservative management and not require surgery. 2. Essential hypertension: Home atenolol held due to n.p.o.  Currently on clonidine patch 0.1 mg.  Reasonably controlled. 3. Type II DM: Home vildagliptin on hold.  Well controlled.  Added SSI. 4. Hypokalemia: Replacing IV.  Magnesium 2.4.  Follow BMP in a.m.    DVT prophylaxis: Subcutaneous heparin Code Status: Full Family Communication: Discussed in detail with patient's daughter at bedside. Disposition: DC home pending clinical improvement   Consultants:  CCS  Procedures:  NG tube 4/17  Antimicrobials:  None   Subjective: Patient does not speak [place].  Daughter at bedside assisted with interpretation.  Overall feels much better.  Abdominal pain significantly improved, mild and intermittent.  Passed a lot of flatus last night and this morning.  No BM since mid last week.  No other complaints reported.  ROS: As above,  otherwise negative.  Objective:  Vitals:   07/18/18 2130 07/19/18 0552 07/19/18 0859 07/19/18 1434  BP: (!) 141/78 (!) 143/72 139/77 (!) 151/74  Pulse: (!) 101 (!) 103 (!) 106 99  Resp: 16 16    Temp: 98 F (36.7 C) 98.1 F (36.7 C) 97.6 F (36.4 C) 97.7 F (36.5 C)  TempSrc: Oral Oral Oral Oral  SpO2: 94% 94% 95% 94%  Weight:      Height:        Examination:  General exam: Pleasant elderly female, moderately built and overweight, lying comfortably propped up in bed.  Oral mucosa moist. Respiratory system: Clear to auscultation. Respiratory effort normal. Cardiovascular system: S1 & S2 heard, RRR. No JVD, murmurs, rubs, gallops or clicks. No pedal edema. Gastrointestinal system: Abdomen is nondistended, soft and nontender. No organomegaly or masses felt. Normal bowel sounds heard.  NG tube in place connected to wall suction draining bilious liquid.  Midline laparotomy scar. Central nervous system: Alert and oriented. No focal neurological deficits. Extremities: Symmetric 5 x 5 power. Skin: No rashes, lesions or ulcers Psychiatry: Judgement and insight appear normal. Mood & affect appropriate.     Data Reviewed: I have personally reviewed following labs and imaging studies  CBC: Recent Labs  Lab 07/18/18 1025 07/18/18 2028 07/19/18 0220  WBC 8.0 7.3 6.8  NEUTROABS 5.9  --   --   HGB 13.9 13.7 13.0  HCT 42.6 42.9 40.8  MCV 86.9 85.6 85.5  PLT 235 233 974   Basic Metabolic Panel: Recent Labs  Lab 07/18/18 1025 07/18/18  2028 07/19/18 0220  NA 134*  --  137  K 3.7  --  3.4*  CL 95*  --  96*  CO2 27  --  29  GLUCOSE 148*  --  110*  BUN 21  --  18  CREATININE 0.70 0.77 0.73  CALCIUM 9.2  --  9.2  MG  --   --  2.4   Liver Function Tests: Recent Labs  Lab 07/18/18 1025  AST 19  ALT 14  ALKPHOS 53  BILITOT 1.5*  PROT 7.4  ALBUMIN 4.0   CBG: Recent Labs  Lab 07/19/18 0106 07/19/18 0941 07/19/18 1156  GLUCAP 122* 103* 97    No results found for  this or any previous visit (from the past 240 hour(s)).       Radiology Studies: Ct Abdomen Pelvis W Contrast  Result Date: 07/18/2018 CLINICAL DATA:  Bowel obstruction, high-grade. EXAM: CT ABDOMEN AND PELVIS WITH CONTRAST TECHNIQUE: Multidetector CT imaging of the abdomen and pelvis was performed using the standard protocol following bolus administration of intravenous contrast. CONTRAST:  158mL OMNIPAQUE IOHEXOL 300 MG/ML  SOLN COMPARISON:  None. FINDINGS: Lower chest: Mild dependent atelectasis is present at the lung bases bilaterally. The heart size is normal. No significant pleural or pericardial effusion is present. Coronary artery calcifications are present. Hepatobiliary: Mild intrahepatic biliary dilation is present. A cyst is present posteriorly in the right lobe of the liver measuring 10 mm. Common bile duct is within normal limits. No other parenchymal lesions are present. The gallbladder is normal. Pancreas: Unremarkable. No pancreatic ductal dilatation or surrounding inflammatory changes. Spleen: Normal in size without focal abnormality. Adrenals/Urinary Tract: Adrenal glands are normal bilaterally. Kidneys and ureters are within normal limits. The urinary bladder is normal. Stomach/Bowel: The stomach and duodenum are within normal limits. The proximal small bowel is moderately dilated. There is a transition point just left of midline in the ventral abdomen. No focal mass lesion is present. There is fecalized material at the transition point. There is also some free fluid. No free air is present. The more distal small bowel is decompressed. The ascending and transverse colon are normal. The descending and sigmoid colon are normal. Vascular/Lymphatic: Atherosclerotic calcifications are present in the aorta and branch vessels without aneurysm. No significant adenopathy is present. Reproductive: Uterus and bilateral adnexa are unremarkable. Other: No other free fluid or free air is present.  There is no ventral hernia. Musculoskeletal: Grade 1 anterolisthesis is present at L4-5. AP alignment is otherwise anatomic. A vacuum disc is present at L5-S1. IMPRESSION: 1. Small bowel obstruction with a transition point in the ventral abdomen. No mass lesion is present. This may be due to adhesions. 2. Fluid adjacent to the transition point is likely inflammatory. 3. Mild intrahepatic biliary dilation without an obstructing lesion. This may be within normal limits. 4. Degenerative changes in the lower lumbar spine. Electronically Signed   By: San Morelle M.D.   On: 07/18/2018 12:13   Dg Chest Portable 1 View  Result Date: 07/18/2018 CLINICAL DATA:  Nasogastric placement for small-bowel obstruction. EXAM: PORTABLE CHEST 1 VIEW COMPARISON:  CT same day FINDINGS: Nasogastric tube enters the stomach with its tip in the fundus. Dilated fluid and air-filled loops of small bowel are partially depicted. Active cardiopulmonary disease. Heart size is normal. Lungs are clear. IMPRESSION: Nasogastric tube enters the stomach with its tip in the fundus. Electronically Signed   By: Nelson Chimes M.D.   On: 07/18/2018 13:41   Dg Abd Portable  1v-small Bowel Obstruction Protocol-initial, 8 Hr Delay  Result Date: 07/19/2018 CLINICAL DATA:  Small bowel obstruction. EXAM: PORTABLE ABDOMEN - 1 VIEW COMPARISON:  07/18/2018 FINDINGS: An enteric tube terminates over the proximal stomach, unchanged. There is persistent moderate dilatation of multiple small bowel loops in the upper and mid abdomen, similar to the prior study. Small volume stool and gas are present in nondilated colon. Gas is present in the rectum. No gross intraperitoneal free air is identified on this supine study. No acute osseous abnormality is seen. IMPRESSION: Unchanged small bowel dilatation consistent with obstruction. Electronically Signed   By: Logan Bores M.D.   On: 07/19/2018 08:46   Dg Abd Portable 1v-small Bowel Protocol-position  Verification  Result Date: 07/18/2018 CLINICAL DATA:  Check gastric catheter placement EXAM: PORTABLE ABDOMEN - 1 VIEW COMPARISON:  CT from earlier in the same day. FINDINGS: Gastric catheter is now seen within the stomach. Scattered small bowel dilatation is again noted. No free air is seen. IMPRESSION: Gastric catheter within the stomach. Electronically Signed   By: Inez Catalina M.D.   On: 07/18/2018 22:09        Scheduled Meds:  cloNIDine  0.1 mg Transdermal Q Fri-1800   heparin  5,000 Units Subcutaneous Q8H   Continuous Infusions:   LOS: 1 day     Vernell Leep, MD, FACP, [facility]. [facility]  To contact the attending provider between 7A-7P or the covering provider during after hours 7P-7A, please log into the web site [email or web address] and access using universal [place] password for that web site. If you do not have the password, please call the hospital operator.  07/19/2018, 4:11 PM

## 2018-07-20 ENCOUNTER — Inpatient Hospital Stay (HOSPITAL_COMMUNITY): Payer: Self-pay

## 2018-07-20 LAB — BASIC METABOLIC PANEL
Anion gap: 11 (ref 5–15)
BUN: 20 mg/dL (ref 8–23)
CO2: 29 mmol/L (ref 22–32)
Calcium: 8.8 mg/dL — ABNORMAL LOW (ref 8.9–10.3)
Chloride: 100 mmol/L (ref 98–111)
Creatinine, Ser: 0.64 mg/dL (ref 0.44–1.00)
GFR calc Af Amer: >60 mL/min (ref >60)
GFR calc non Af Amer: >60 mL/min (ref >60)
Glucose, Bld: 131 mg/dL — ABNORMAL HIGH (ref 70–99)
Potassium: 3.5 mmol/L (ref 3.5–5.1)
Sodium: 140 mmol/L (ref 135–145)

## 2018-07-20 LAB — GLUCOSE, CAPILLARY
Glucose-Capillary: 122 mg/dL — ABNORMAL HIGH (ref 70–99)
Glucose-Capillary: 124 mg/dL — ABNORMAL HIGH (ref 70–99)
Glucose-Capillary: 133 mg/dL — ABNORMAL HIGH (ref 70–99)
Glucose-Capillary: 135 mg/dL — ABNORMAL HIGH (ref 70–99)
Glucose-Capillary: 135 mg/dL — ABNORMAL HIGH (ref 70–99)
Glucose-Capillary: 149 mg/dL — ABNORMAL HIGH (ref 70–99)

## 2018-07-20 MED ORDER — PANTOPRAZOLE SODIUM 40 MG IV SOLR
40.0000 mg | INTRAVENOUS | Status: DC
  Administered 2018-07-20 – 2018-07-22 (×3): 40 mg via INTRAVENOUS
  Filled 2018-07-20 (×3): qty 40

## 2018-07-20 MED ORDER — KCL IN DEXTROSE-NACL 20-5-0.9 MEQ/L-%-% IV SOLN
INTRAVENOUS | Status: AC
  Administered 2018-07-21: via INTRAVENOUS
  Filled 2018-07-20: qty 1000

## 2018-07-20 MED ORDER — KETOROLAC TROMETHAMINE 30 MG/ML IJ SOLN
15.0000 mg | Freq: Once | INTRAMUSCULAR | Status: AC
  Administered 2018-07-20: 23:00:00 15 mg via INTRAVENOUS
  Filled 2018-07-20: qty 1

## 2018-07-20 MED ORDER — DIPHENHYDRAMINE HCL 50 MG/ML IJ SOLN
12.5000 mg | Freq: Once | INTRAMUSCULAR | Status: AC
  Administered 2018-07-20: 12.5 mg via INTRAVENOUS
  Filled 2018-07-20: qty 1

## 2018-07-20 MED ORDER — ONDANSETRON HCL 4 MG/2ML IJ SOLN
4.0000 mg | INTRAMUSCULAR | Status: DC | PRN
  Administered 2018-07-20: 4 mg via INTRAVENOUS
  Filled 2018-07-20 (×2): qty 2

## 2018-07-20 NOTE — Progress Notes (Signed)
PROGRESS NOTE   Erin Burke  JJO:[id number]    DOB: [birth year]    DOA: 07/18/2018  PCP: Patient, No Pcp Per   I have briefly reviewed patients previous medical records in [facility].  Brief Narrative:  78 year old [place] female, visiting daughter in [place] and stay prolonged due to travel restrictions from current COVID pandemic, PMH of DM 2, HTN, prior SBO for which she had surgery 2 years ago, presented to [facility] ED 4/17 due to 4 days history of abdominal pain, nausea and vomiting and found to have high-grade mid to distal small bowel obstruction on CT abdomen.  Treating conservatively with NPO, IV fluids and NG tube.  General surgery consulted.   Assessment & Plan:   Active Problems:   SBO (small bowel obstruction) ([place])   1. SBO: Possibly due to adhesions from prior laparotomy.  General surgery consulted.  Treating conservatively with NPO, NG tube, IV fluids.  Passing flatus.  No BM.  Repeat KUB 4/18 shows no change. Hopefully will resolve with conservative management and not require surgery.  KUB better.  CCS follow-up appreciated, clamping NGT and trying clear liquids and if no nausea and vomiting then plan to DC NG tube later today. 2. Essential hypertension: Home atenolol held due to n.p.o.  Currently on clonidine patch 0.1 mg.  Reasonably controlled.  Resume atenolol at discharge. 3. Type II DM: Home vildagliptin on hold.  Well controlled.  Added SSI. 4. Hypokalemia: Better.  Magnesium 2.4.  Follow BMP in a.m. Continue potassium supplements and IV fluids. 5. Asymptomatic bacteriuria:    DVT prophylaxis: Subcutaneous heparin Code Status: Full Family Communication: Discussed in detail with patient's daughter at bedside, updated care and answered questions. Disposition: DC home pending clinical improvement   Consultants:  CCS  Procedures:  NG tube 4/17  Antimicrobials:  None   Subjective: Seen this morning.  Daughter at bedside.  Abdominal pain resolved.   Passing flatus but no BM yet.  Ambulating well in the room.  Patient had been sneaking small amounts of water overnight.  ROS: As above, otherwise negative.  Objective:  Vitals:   07/19/18 1434 07/19/18 2114 07/20/18 0527 07/20/18 1436  BP: (!) 151/74 (!) 146/72 137/72 (!) 159/72  Pulse: 99 94 99 84  Resp:  16 16 16   Temp: 97.7 F (36.5 C) 98 F (36.7 C) 98.5 F (36.9 C) 98.4 F (36.9 C)  TempSrc: Oral Oral Oral Oral  SpO2: 94% 93% 95% 93%  Weight:      Height:        Examination:  General exam: Pleasant elderly female, moderately built and overweight, sitting up comfortably in bed.  Oral mucosa moist. Respiratory system: Clear to auscultation. Respiratory effort normal.  Stable Cardiovascular system: S1 & S2 heard, RRR. No JVD, murmurs, rubs, gallops or clicks. No pedal edema.  Stable Gastrointestinal system: Abdomen is nondistended, soft and nontender. No organomegaly or masses felt. Normal bowel sounds heard.  NG tube has been clamped.  Midline laparotomy scar. Central nervous system: Alert and oriented. No focal neurological deficits.  Stable Extremities: Symmetric 5 x 5 power. Skin: No rashes, lesions or ulcers Psychiatry: Judgement and insight appear normal. Mood & affect appropriate.     Data Reviewed: I have personally reviewed following labs and imaging studies  CBC: Recent Labs  Lab 07/18/18 1025 07/18/18 2028 07/19/18 0220  WBC 8.0 7.3 6.8  NEUTROABS 5.9  --   --   HGB 13.9 13.7 13.0  HCT 42.6 42.9 40.8  MCV  86.9 85.6 85.5  PLT 235 233 272   Basic Metabolic Panel: Recent Labs  Lab 07/18/18 1025 07/18/18 2028 07/19/18 0220 07/20/18 0240  NA 134*  --  137 140  K 3.7  --  3.4* 3.5  CL 95*  --  96* 100  CO2 27  --  29 29  GLUCOSE 148*  --  110* 131*  BUN 21  --  18 20  CREATININE 0.70 0.77 0.73 0.64  CALCIUM 9.2  --  9.2 8.8*  MG  --   --  2.4  --    Liver Function Tests: Recent Labs  Lab 07/18/18 1025  AST 19  ALT 14  ALKPHOS 53   BILITOT 1.5*  PROT 7.4  ALBUMIN 4.0   CBG: Recent Labs  Lab 07/20/18 0012 07/20/18 0432 07/20/18 0809 07/20/18 1155 07/20/18 1513  GLUCAP 122* 124* 135* 133* 135*    Recent Results (from the past 240 hour(s))  Urine culture     Status: Abnormal   Collection Time: 07/18/18 12:48 PM  Result Value Ref Range Status   Specimen Description   Final    URINE, CLEAN CATCH Performed at [facility], [address] [place], [place] [address]    Special Requests   Final    NONE Performed at [facility], [address] [place], [place] [address]    Culture MULTIPLE SPECIES PRESENT, SUGGEST RECOLLECTION (A)  Final   Report Status 07/19/2018 FINAL  Final         Radiology Studies: Dg Abd 1 View  Result Date: 07/20/2018 CLINICAL DATA:  SBO EXAM: ABDOMEN - 1 VIEW COMPARISON:  Radiograph 07/19/2018 FINDINGS: NG tube extends the stomach. No dilated large or small bowel. Interval decrease in bowel distension seen on comparison exam. Gas in the rectum IMPRESSION: 1. Decrease in bowel distention. Gas in the rectum. Nonobstructive bowel gas pattern. 2. NG tube in stomach. Electronically Signed   By: Suzy Bouchard M.D.   On: 07/20/2018 07:51   Dg Abd Portable 1v-small Bowel Obstruction Protocol-initial, 8 Hr Delay  Result Date: 07/19/2018 CLINICAL DATA:  Small bowel obstruction. EXAM: PORTABLE ABDOMEN - 1 VIEW COMPARISON:  07/18/2018 FINDINGS: An enteric tube terminates over the proximal stomach, unchanged. There is persistent moderate dilatation of multiple small bowel loops in the upper and mid abdomen, similar to the prior study. Small volume stool and gas are present in nondilated colon. Gas is present in the rectum. No gross intraperitoneal free air is identified on this supine study. No acute osseous abnormality is seen. IMPRESSION: Unchanged small bowel dilatation consistent with obstruction. Electronically Signed   By: Logan Bores M.D.   On: 07/19/2018 08:46    Dg Abd Portable 1v-small Bowel Protocol-position Verification  Result Date: 07/18/2018 CLINICAL DATA:  Check gastric catheter placement EXAM: PORTABLE ABDOMEN - 1 VIEW COMPARISON:  CT from earlier in the same day. FINDINGS: Gastric catheter is now seen within the stomach. Scattered small bowel dilatation is again noted. No free air is seen. IMPRESSION: Gastric catheter within the stomach. Electronically Signed   By: Inez Catalina M.D.   On: 07/18/2018 22:09        Scheduled Meds: . cloNIDine  0.1 mg Transdermal Q Fri-1800  . heparin  5,000 Units Subcutaneous Q8H  . insulin aspart  0-9 Units Subcutaneous Q4H   Continuous Infusions:   LOS: 2 days     Vernell Leep, MD, FACP, [facility]. [facility]  To contact the attending  provider between 7A-7P or the covering provider during after hours 7P-7A, please log into the web site [email or web address] and access using universal [place] password for that web site. If you do not have the password, please call the hospital operator.  07/20/2018, 4:39 PM

## 2018-07-20 NOTE — Progress Notes (Signed)
0175: NGT clamped

## 2018-07-20 NOTE — Progress Notes (Signed)
Subjective/Chief Complaint: Passed some flatus No BM, no n/v KUB noted  Objective: Vital signs in last 24 hours: Temp:  [97.7 F (36.5 C)-98.5 F (36.9 C)] 98.5 F (36.9 C) (04/19 0527) Pulse Rate:  [94-99] 99 (04/19 0527) Resp:  [16] 16 (04/19 0527) BP: (137-151)/(72-74) 137/72 (04/19 0527) SpO2:  [93 %-95 %] 95 % (04/19 0527) Last BM Date: 07/14/18  Intake/Output from previous day: 04/18 0701 - 04/19 0700 In: 1410.5 [I.V.:1072.3; IV Piggyback:338.2] Out: 1900 [Emesis/NG output:1900] Intake/Output this shift: Total I/O In: -  Out: 450 [Emesis/NG output:450]  Constitutional: No acute distress, conversant, appears states age. Eyes: Anicteric sclerae, moist conjunctiva, no lid lag Lungs: Clear to auscultation bilaterally, normal respiratory effort CV: regular rate and rhythm, no murmurs, no peripheral edema, pedal pulses 2+ GI: Soft, no masses or hepatosplenomegaly, non-tender to palpation, hypoactive BS Skin: No rashes, palpation reveals normal turgor Psychiatric: appropriate judgment and insight, oriented to person, place, and time    Lab Results:  Recent Labs    07/18/18 2028 07/19/18 0220  WBC 7.3 6.8  HGB 13.7 13.0  HCT 42.9 40.8  PLT 233 232   BMET Recent Labs    07/19/18 0220 07/20/18 0240  NA 137 140  K 3.4* 3.5  CL 96* 100  CO2 29 29  GLUCOSE 110* 131*  BUN 18 20  CREATININE 0.73 0.64  CALCIUM 9.2 8.8*   PT/INR No results for input(s): LABPROT, INR in the last 72 hours. ABG No results for input(s): PHART, HCO3 in the last 72 hours.  Invalid input(s): PCO2, PO2  Studies/Results: Dg Abd 1 View  Result Date: 07/20/2018 CLINICAL DATA:  SBO EXAM: ABDOMEN - 1 VIEW COMPARISON:  Radiograph 07/19/2018 FINDINGS: NG tube extends the stomach. No dilated large or small bowel. Interval decrease in bowel distension seen on comparison exam. Gas in the rectum IMPRESSION: 1. Decrease in bowel distention. Gas in the rectum. Nonobstructive bowel gas  pattern. 2. NG tube in stomach. Electronically Signed   By: Suzy Bouchard M.D.   On: 07/20/2018 07:51   Ct Abdomen Pelvis W Contrast  Result Date: 07/18/2018 CLINICAL DATA:  Bowel obstruction, high-grade. EXAM: CT ABDOMEN AND PELVIS WITH CONTRAST TECHNIQUE: Multidetector CT imaging of the abdomen and pelvis was performed using the standard protocol following bolus administration of intravenous contrast. CONTRAST:  164mL OMNIPAQUE IOHEXOL 300 MG/ML  SOLN COMPARISON:  None. FINDINGS: Lower chest: Mild dependent atelectasis is present at the lung bases bilaterally. The heart size is normal. No significant pleural or pericardial effusion is present. Coronary artery calcifications are present. Hepatobiliary: Mild intrahepatic biliary dilation is present. A cyst is present posteriorly in the right lobe of the liver measuring 10 mm. Common bile duct is within normal limits. No other parenchymal lesions are present. The gallbladder is normal. Pancreas: Unremarkable. No pancreatic ductal dilatation or surrounding inflammatory changes. Spleen: Normal in size without focal abnormality. Adrenals/Urinary Tract: Adrenal glands are normal bilaterally. Kidneys and ureters are within normal limits. The urinary bladder is normal. Stomach/Bowel: The stomach and duodenum are within normal limits. The proximal small bowel is moderately dilated. There is a transition point just left of midline in the ventral abdomen. No focal mass lesion is present. There is fecalized material at the transition point. There is also some free fluid. No free air is present. The more distal small bowel is decompressed. The ascending and transverse colon are normal. The descending and sigmoid colon are normal. Vascular/Lymphatic: Atherosclerotic calcifications are present in the aorta and branch  vessels without aneurysm. No significant adenopathy is present. Reproductive: Uterus and bilateral adnexa are unremarkable. Other: No other free fluid or free  air is present. There is no ventral hernia. Musculoskeletal: Grade 1 anterolisthesis is present at L4-5. AP alignment is otherwise anatomic. A vacuum disc is present at L5-S1. IMPRESSION: 1. Small bowel obstruction with a transition point in the ventral abdomen. No mass lesion is present. This may be due to adhesions. 2. Fluid adjacent to the transition point is likely inflammatory. 3. Mild intrahepatic biliary dilation without an obstructing lesion. This may be within normal limits. 4. Degenerative changes in the lower lumbar spine. Electronically Signed   By: San Morelle M.D.   On: 07/18/2018 12:13   Dg Chest Portable 1 View  Result Date: 07/18/2018 CLINICAL DATA:  Nasogastric placement for small-bowel obstruction. EXAM: PORTABLE CHEST 1 VIEW COMPARISON:  CT same day FINDINGS: Nasogastric tube enters the stomach with its tip in the fundus. Dilated fluid and air-filled loops of small bowel are partially depicted. Active cardiopulmonary disease. Heart size is normal. Lungs are clear. IMPRESSION: Nasogastric tube enters the stomach with its tip in the fundus. Electronically Signed   By: Nelson Chimes M.D.   On: 07/18/2018 13:41   Dg Abd Portable 1v-small Bowel Obstruction Protocol-initial, 8 Hr Delay  Result Date: 07/19/2018 CLINICAL DATA:  Small bowel obstruction. EXAM: PORTABLE ABDOMEN - 1 VIEW COMPARISON:  07/18/2018 FINDINGS: An enteric tube terminates over the proximal stomach, unchanged. There is persistent moderate dilatation of multiple small bowel loops in the upper and mid abdomen, similar to the prior study. Small volume stool and gas are present in nondilated colon. Gas is present in the rectum. No gross intraperitoneal free air is identified on this supine study. No acute osseous abnormality is seen. IMPRESSION: Unchanged small bowel dilatation consistent with obstruction. Electronically Signed   By: Logan Bores M.D.   On: 07/19/2018 08:46   Dg Abd Portable 1v-small Bowel  Protocol-position Verification  Result Date: 07/18/2018 CLINICAL DATA:  Check gastric catheter placement EXAM: PORTABLE ABDOMEN - 1 VIEW COMPARISON:  CT from earlier in the same day. FINDINGS: Gastric catheter is now seen within the stomach. Scattered small bowel dilatation is again noted. No free air is seen. IMPRESSION: Gastric catheter within the stomach. Electronically Signed   By: Inez Catalina M.D.   On: 07/18/2018 22:09    Anti-infectives: Anti-infectives (From admission, onward)   None      Assessment/Plan: 68 F w/ SBO  - KUB look better today and having flatus. clamp NGT and start clears. If no n/v later today can DC NGT -no surgical plans at this time. -ambulate in hallway  LOS: 2 days    Ralene Ok 07/20/2018

## 2018-07-20 NOTE — Plan of Care (Signed)
  Problem: Nutrition: Goal: Adequate nutrition will be maintained Outcome: Not Progressing  Unable to tolerate clear liquid diet, N/V. IVFs in place.

## 2018-07-21 ENCOUNTER — Inpatient Hospital Stay (HOSPITAL_COMMUNITY): Payer: Self-pay

## 2018-07-21 LAB — BASIC METABOLIC PANEL
Anion gap: 9 (ref 5–15)
BUN: 18 mg/dL (ref 8–23)
CO2: 32 mmol/L (ref 22–32)
Calcium: 8.7 mg/dL — ABNORMAL LOW (ref 8.9–10.3)
Chloride: 99 mmol/L (ref 98–111)
Creatinine, Ser: 0.84 mg/dL (ref 0.44–1.00)
GFR calc Af Amer: >60 mL/min (ref >60)
GFR calc non Af Amer: >60 mL/min (ref >60)
Glucose, Bld: 111 mg/dL — ABNORMAL HIGH (ref 70–99)
Potassium: 3.6 mmol/L (ref 3.5–5.1)
Sodium: 140 mmol/L (ref 135–145)

## 2018-07-21 LAB — MAGNESIUM: Magnesium: 2.3 mg/dL (ref 1.7–2.4)

## 2018-07-21 LAB — GLUCOSE, CAPILLARY
Glucose-Capillary: 100 mg/dL — ABNORMAL HIGH (ref 70–99)
Glucose-Capillary: 110 mg/dL — ABNORMAL HIGH (ref 70–99)
Glucose-Capillary: 118 mg/dL — ABNORMAL HIGH (ref 70–99)
Glucose-Capillary: 125 mg/dL — ABNORMAL HIGH (ref 70–99)
Glucose-Capillary: 126 mg/dL — ABNORMAL HIGH (ref 70–99)
Glucose-Capillary: 97 mg/dL (ref 70–99)

## 2018-07-21 MED ORDER — DIPHENHYDRAMINE HCL 50 MG/ML IJ SOLN
25.0000 mg | Freq: Once | INTRAMUSCULAR | Status: AC
  Administered 2018-07-21: 25 mg via INTRAVENOUS
  Filled 2018-07-21: qty 1

## 2018-07-21 MED ORDER — KETOROLAC TROMETHAMINE 15 MG/ML IJ SOLN
15.0000 mg | Freq: Once | INTRAMUSCULAR | Status: AC
  Administered 2018-07-21: 15 mg via INTRAVENOUS
  Filled 2018-07-21: qty 1

## 2018-07-21 MED ORDER — KCL IN DEXTROSE-NACL 20-5-0.9 MEQ/L-%-% IV SOLN
INTRAVENOUS | Status: AC
  Administered 2018-07-21: 20:00:00 via INTRAVENOUS
  Filled 2018-07-21 (×2): qty 1000

## 2018-07-21 NOTE — Progress Notes (Signed)
CC:  Abdominal pain  Subjective: The NG was clamped and she had Nausea and vomiting, one small BM before the clamping.  NG was placed back on suction last PM.  With 1500 recorded for the day, not much since last PM.  She has only walked in the room.  No flatus or BM noted since BM yesterday AM.    Objective: Vital signs in last 24 hours: Temp:  [97.8 F (36.6 C)-98.4 F (36.9 C)] 98.1 F (36.7 C) (04/20 0421) Pulse Rate:  [83-84] 83 (04/20 0421) Resp:  [16] 16 (04/20 0421) BP: (124-159)/(56-72) 124/56 (04/20 0421) SpO2:  [92 %-93 %] 92 % (04/20 0421) Last BM Date: 07/20/18 640 PO recorded 1273 IV Urine x 2 NG 1500 recorded Stool x 1 recorded Afebrile, VSS 1 view abd film 4/19:  Decrease in bowel distention. Gas in the rectum. Nonobstructive bowel gas pattern. 2. NG tube in stomach.  Labs OK K+ 3.6Intake/Output from previous day: 04/19 0701 - 04/20 0700 In: 1913.9 [P.O.:640; I.V.:1273.9] Out: 1500 [Emesis/NG output:1500] Intake/Output this shift: No intake/output data recorded.  General appearance: alert, cooperative and no distress Resp: clear to auscultation bilaterally GI: soft she does not appear distended now, no tenderness, BS still diminished, No BM since yesterday .  rare flatus  Lab Results:  Recent Labs    07/18/18 2028 07/19/18 0220  WBC 7.3 6.8  HGB 13.7 13.0  HCT 42.9 40.8  PLT 233 232    BMET Recent Labs    07/20/18 0240 07/21/18 0236  NA 140 140  K 3.5 3.6  CL 100 99  CO2 29 32  GLUCOSE 131* 111*  BUN 20 18  CREATININE 0.64 0.84  CALCIUM 8.8* 8.7*   PT/INR No results for input(s): LABPROT, INR in the last 72 hours.  Recent Labs  Lab 07/18/18 1025  AST 19  ALT 14  ALKPHOS 53  BILITOT 1.5*  PROT 7.4  ALBUMIN 4.0     Lipase     Component Value Date/Time   LIPASE 21 07/18/2018 1025     . dextrose 5 % and 0.9 % NaCl with KCl 20 mEq/L 75 mL/hr at 07/21/18 0010     Prior to Admission medications   Medication Sig  Start Date End Date Taking? Authorizing Provider  atenolol (TENORMIN) 50 MG tablet Take 50 mg by mouth.    Yes [provider]  Naphazoline HCl (CLEAR EYES OP) Place 1 drop into both eyes daily as needed (seasonal allergies).   Yes [provider]  PRESCRIPTION MEDICATION Take 1 tablet by mouth daily. Eucreas 50/850 - vildagliptin and metformin   Yes [provider]    Anti-infectives (From admission, onward)   None     Medications: . cloNIDine  0.1 mg Transdermal Q Fri-1800  . heparin  5,000 Units Subcutaneous Q8H  . insulin aspart  0-9 Units Subcutaneous Q4H  . pantoprazole (PROTONIX) IV  40 mg Intravenous Q24H    Assessment/Plan Type II diabetes Hypertension Daughter is translating she speaks only [place]  SBO - first SBO ~ 10 years ago - [place] Prior abdominal surgery " tubal ligation, but has large midline scar and RLQ scar  In [place] ~40 years ago  FEN:  IV fluids/NPO ID:  None DVT:  Heparin Follow up:  TBD   Plan:  I'm going to send her down for another film.  Mobilize her more, clamping trial and give her some sips and chips.  Check magnesium.    LOS: 3  days    Earnstine Regal 07/21/2018 [phone]

## 2018-07-21 NOTE — Progress Notes (Signed)
PROGRESS NOTE   Erin Burke  VVO:[id number]    DOB: Jan 19, 1941    DOA: 07/18/2018  PCP: Patient, No Pcp Per   I have briefly reviewed patients previous medical records in [facility].  Brief Narrative:  78 year old [place] female, visiting daughter in [place] and stay prolonged due to travel restrictions from current COVID pandemic, PMH of DM 2, HTN, prior SBO for which she had surgery 2 years ago, presented to [facility] ED 4/17 due to 4 days history of abdominal pain, nausea and vomiting and found to have high-grade mid to distal small bowel obstruction on CT abdomen.  Treating conservatively with NPO, IV fluids and NG tube.  General surgery consulted.  Slowly improving.   Assessment & Plan:   Active Problems:   SBO (small bowel obstruction) ([place])   1. SBO: Possibly due to adhesions from prior laparotomy.  General surgery consulted.  Treating conservatively with NPO, NG tube, IV fluids.  Passing flatus.  No BM.  Repeat KUB 4/18 shows no change. Hopefully will resolve with conservative management and not require surgery.  KUB better.  On 4/19, failed trial of NG tube clamping and clears, vomited x2.  Better since, no abdominal pain, had small BM, surgery has seen, attempting NGT clamping and sips of clears, if tolerates then probable DC NGT 4/21 and start diet.  KUB 4/20: No SBO or ileus. 2. Essential hypertension: Home atenolol held due to n.p.o.  Currently on clonidine patch 0.1 mg.  Reasonably controlled.  Resume atenolol at discharge. 3. Type II DM: Home vildagliptin on hold.  Well controlled.  Added SSI. 4. Hypokalemia: Replaced.  Magnesium 2.4.  Continue potassium supplements and IV fluids. 5. Asymptomatic bacteriuria:    DVT prophylaxis: Subcutaneous heparin Code Status: Full Family Communication: Discussed in detail with patient's daughter at bedside, updated care and answered questions. Disposition: DC home pending clinical improvement, hopefully in the next 1 to 2 days.   Consultants:  CCS  Procedures:  NG tube 4/17  Antimicrobials:  None   Subjective: Vomited x2 yesterday afternoon after NGT clamping and trial of clear liquids.  Better today.  No abdominal distention or pain.  Throat discomfort related to NG tube.  Had small BM early this morning.  Very little NG output since yesterday evening.  ROS: As above, otherwise negative.  Objective:  Vitals:   07/20/18 1436 07/20/18 2026 07/21/18 0421 07/21/18 1246  BP: (!) 159/72  (!) 124/56 (!) 147/65  Pulse: 84  83 83  Resp: 16  16   Temp: 98.4 F (36.9 C) 97.8 F (36.6 C) 98.1 F (36.7 C) 98.5 F (36.9 C)  TempSrc: Oral     SpO2: 93%  92%   Weight:      Height:        Examination:  General exam: Pleasant elderly female, moderately built and overweight, lying comfortably supine in bed.  Oral mucosa moist. Respiratory system: Clear to auscultation.  No increased work of breathing Cardiovascular system: S1 & S2 heard, RRR. No JVD, murmurs, rubs, gallops or clicks. No pedal edema.  Stable Gastrointestinal system: Abdomen is nondistended, soft and nontender. No organomegaly or masses felt. Normal bowel sounds heard.  NG tube +.  Midline laparotomy scar. Central nervous system: Alert and oriented. No focal neurological deficits.  Stable Extremities: Symmetric 5 x 5 power. Skin: No rashes, lesions or ulcers Psychiatry: Judgement and insight appear normal. Mood & affect appropriate.     Data Reviewed: I have personally reviewed following labs and imaging  studies  CBC: Recent Labs  Lab 07/18/18 1025 07/18/18 2028 07/19/18 0220  WBC 8.0 7.3 6.8  NEUTROABS 5.9  --   --   HGB 13.9 13.7 13.0  HCT 42.6 42.9 40.8  MCV 86.9 85.6 85.5  PLT 235 233 275   Basic Metabolic Panel: Recent Labs  Lab 07/18/18 1025 07/18/18 2028 07/19/18 0220 07/20/18 0240 07/21/18 0236  NA 134*  --  137 140 140  K 3.7  --  3.4* 3.5 3.6  CL 95*  --  96* 100 99  CO2 27  --  29 29 32  GLUCOSE 148*  --   110* 131* 111*  BUN 21  --  18 20 18   CREATININE 0.70 0.77 0.73 0.64 0.84  CALCIUM 9.2  --  9.2 8.8* 8.7*  MG  --   --  2.4  --  2.3   Liver Function Tests: Recent Labs  Lab 07/18/18 1025  AST 19  ALT 14  ALKPHOS 53  BILITOT 1.5*  PROT 7.4  ALBUMIN 4.0   CBG: Recent Labs  Lab 07/20/18 2018 07/21/18 0014 07/21/18 0417 07/21/18 0804 07/21/18 1243  GLUCAP 149* 126* 110* 118* 125*    Recent Results (from the past 240 hour(s))  Urine culture     Status: Abnormal   Collection Time: 07/18/18 12:48 PM  Result Value Ref Range Status   Specimen Description   Final    URINE, CLEAN CATCH Performed at [facility], [address] [place], [place] [address]    Special Requests   Final    NONE Performed at [facility], [address] [place], [place] [address]    Culture MULTIPLE SPECIES PRESENT, SUGGEST RECOLLECTION (A)  Final   Report Status 07/19/2018 FINAL  Final         Radiology Studies: Dg Abd 1 View  Result Date: 07/21/2018 CLINICAL DATA:  Acute generalized abdominal pain and distention. EXAM: ABDOMEN - 1 VIEW COMPARISON:  Radiograph of July 20, 2018. FINDINGS: The bowel gas pattern is normal. Nasogastric tube tip is seen in proximal stomach. No radio-opaque calculi or other significant radiographic abnormality are seen. IMPRESSION: No evidence of bowel obstruction or ileus. Electronically Signed   By: Marijo Conception M.D.   On: 07/21/2018 11:22   Dg Abd 1 View  Result Date: 07/20/2018 CLINICAL DATA:  SBO EXAM: ABDOMEN - 1 VIEW COMPARISON:  Radiograph 07/19/2018 FINDINGS: NG tube extends the stomach. No dilated large or small bowel. Interval decrease in bowel distension seen on comparison exam. Gas in the rectum IMPRESSION: 1. Decrease in bowel distention. Gas in the rectum. Nonobstructive bowel gas pattern. 2. NG tube in stomach. Electronically Signed   By: Suzy Bouchard M.D.   On: 07/20/2018 07:51        Scheduled Meds: .  cloNIDine  0.1 mg Transdermal Q Fri-1800  . heparin  5,000 Units Subcutaneous Q8H  . insulin aspart  0-9 Units Subcutaneous Q4H  . pantoprazole (PROTONIX) IV  40 mg Intravenous Q24H   Continuous Infusions:   LOS: 3 days     Vernell Leep, MD, FACP, [facility]. [facility]  To contact the attending provider between 7A-7P or the covering provider during after hours 7P-7A, please log into the web site [email or web address] and access using universal [place] password for that web site. If you do not have the password, please call the hospital operator.  07/21/2018, 4:28 PM

## 2018-07-22 LAB — GLUCOSE, CAPILLARY
Glucose-Capillary: 109 mg/dL — ABNORMAL HIGH (ref 70–99)
Glucose-Capillary: 113 mg/dL — ABNORMAL HIGH (ref 70–99)
Glucose-Capillary: 117 mg/dL — ABNORMAL HIGH (ref 70–99)
Glucose-Capillary: 118 mg/dL — ABNORMAL HIGH (ref 70–99)
Glucose-Capillary: 140 mg/dL — ABNORMAL HIGH (ref 70–99)
Glucose-Capillary: 90 mg/dL (ref 70–99)

## 2018-07-22 MED ORDER — CHLORHEXIDINE GLUCONATE 0.12 % MT SOLN
15.0000 mL | Freq: Two times a day (BID) | OROMUCOSAL | Status: DC
  Administered 2018-07-22 – 2018-07-23 (×2): 15 mL via OROMUCOSAL
  Filled 2018-07-22 (×2): qty 15

## 2018-07-22 MED ORDER — ORAL CARE MOUTH RINSE
15.0000 mL | Freq: Two times a day (BID) | OROMUCOSAL | Status: DC
  Administered 2018-07-23: 15 mL via OROMUCOSAL

## 2018-07-22 MED ORDER — PNEUMOCOCCAL VAC POLYVALENT 25 MCG/0.5ML IJ INJ
0.5000 mL | INJECTION | INTRAMUSCULAR | Status: AC
  Administered 2018-07-23: 0.5 mL via INTRAMUSCULAR
  Filled 2018-07-22 (×2): qty 0.5

## 2018-07-22 NOTE — TOC Initial Note (Signed)
Transition of Care [facility]) - Initial/Assessment Note    Patient Details  Name: Erin Burke MRN: [id number] Date of Birth: March 31, 1941  Transition of Care [facility]) CM/SW Contact:    Sharin Mons, RN Phone Number: 07/22/2018, 2:47 PM  Clinical Narrative:  Presents with c/o N/V, SBO.From [place], speaks greek. Here visiting daughter who speaks [place]. Daughter states PTA independent with ADL's , DME usage. Pt without PCP,no health insurance.                    Assunta Found (Daughter)     [facility] f/u scheduled:     [place]   [phone] [phone] [place] [id number]    Next Steps: Follow up on 07/29/2018    Instructions: 2:50 pm with Juluis Mire NP         Pt has transportation to home.  NCM monitoring for  TOC needs .... ? Assistance with Rx meds @ d/c   Expected Discharge Plan: Home/Self Care Barriers to Discharge: Continued Medical Work up   Patient Goals and CMS Choice Patient states their goals for this hospitalization and ongoing recovery are:: to get home per daughter    Choice offered to / list presented to : NA  Expected Discharge Plan and Services Expected Discharge Plan: Home/Self Care   Discharge Planning Services: CM Consult, Follow-up appt scheduled Post Acute Care Choice: NA                   DME Arranged: N/A DME Agency: NA HH Arranged: NA HH Agency: NA  Prior Living Arrangements/Services   Lives with:: Adult Children Patient language and need for interpreter reviewed:: Yes Do you feel safe going back to the place where you live?: Yes      Need for Family Participation in Patient Care: No (Comment) Care giver support system in place?: No (comment)   Criminal Activity/Legal Involvement Pertinent to Current Situation/Hospitalization: No - Comment as needed  Activities of Daily Living Home Assistive Devices/Equipment: Eyeglasses ADL Screening  (condition at time of admission) Patient's cognitive ability adequate to safely complete daily activities?: Yes Is the patient deaf or have difficulty hearing?: No Does the patient have difficulty seeing, even when wearing glasses/contacts?: No Does the patient have difficulty concentrating, remembering, or making decisions?: No Patient able to express need for assistance with ADLs?: Yes Does the patient have difficulty dressing or bathing?: No Independently performs ADLs?: Yes (appropriate for developmental age) Does the patient have difficulty walking or climbing stairs?: No Weakness of Legs: None Weakness of Arms/Hands: None  Permission Sought/Granted Permission sought to share information with : Case Manager, Family Supports Permission granted to share information with : Yes, Verbal Permission Granted  Share Information with NAME: Assunta Found (Daughter)           Emotional Assessment Appearance:: Appears stated age Attitude/Demeanor/Rapport: Gracious Affect (typically observed): Accepting Orientation: : Oriented to Self, Oriented to  Time, Oriented to Place, Oriented to Situation Alcohol / Substance Use: Not Applicable Psych Involvement: No (comment)  Admission diagnosis:  SBO (small bowel obstruction) ([place]) [K56.609] Patient Active Problem List   Diagnosis Date Noted  . SBO (small bowel obstruction) ([place]) 07/18/2018   PCP:  Patient, No Pcp Per Pharmacy:   [id number] [place], [place] - [address], [address] [facility] [address] [place], [address] [place] [address] Phone: [phone]  Fax: [phone]     Social Determinants of Health (SDOH) Interventions    Readmission Risk Interventions No flowsheet data found.

## 2018-07-22 NOTE — Progress Notes (Signed)
    CC: Abdominal pain  Subjective: Patient is alert she is doing well with NG tube clamped.  She has been ambulating.  No nausea or vomiting.  BM x3.  She reports she was actually hungry some yesterday for the first time.  Objective: Vital signs in last 24 hours: Temp:  [98 F (36.7 C)-98.5 F (36.9 C)] 98 F (36.7 C) (04/21 0532) Pulse Rate:  [72-84] 72 (04/21 0532) Resp:  [19] 19 (04/21 0532) BP: (126-147)/(58-68) 126/58 (04/21 0532) SpO2:  [89 %-94 %] 94 % (04/21 0532) Last BM Date: 07/21/18 30 cc p.o. 04/15/2010 Urine x1 UA NG 550, 50 last 12 hours BM x3 Afebrile vital signs are stable No labs today Single view  abdominal film 4/20:The bowel gas pattern is normal. Nasogastric tube tip is seen in proximal stomach. No radio-opaque calculi or other significant radiographic abnormality are seen.  Intake/Output from previous day: 04/20 0701 - 04/21 0700 In: 1455.3 [P.O.:30; I.V.:1425.3] Out: 550 [Emesis/NG output:550] Intake/Output this shift: No intake/output data recorded.  General appearance: alert, cooperative and no distress Resp: clear to auscultation bilaterally GI: Soft, essentially nontender, positive bowel sounds, positive BM x3  Lab Results:  No results for input(s): WBC, HGB, HCT, PLT in the last 72 hours.  BMET Recent Labs    07/20/18 0240 07/21/18 0236  NA 140 140  K 3.5 3.6  CL 100 99  CO2 29 32  GLUCOSE 131* 111*  BUN 20 18  CREATININE 0.64 0.84  CALCIUM 8.8* 8.7*   PT/INR No results for input(s): LABPROT, INR in the last 72 hours.  Recent Labs  Lab 07/18/18 1025  AST 19  ALT 14  ALKPHOS 53  BILITOT 1.5*  PROT 7.4  ALBUMIN 4.0     Lipase     Component Value Date/Time   LIPASE 21 07/18/2018 1025     Medications: . chlorhexidine  15 mL Mouth Rinse BID  . cloNIDine  0.1 mg Transdermal Q Fri-1800  . heparin  5,000 Units Subcutaneous Q8H  . insulin aspart  0-9 Units Subcutaneous Q4H  . mouth rinse  15 mL Mouth Rinse q12n4p   . pantoprazole (PROTONIX) IV  40 mg Intravenous Q24H  . pneumococcal 23 valent vaccine  0.5 mL Intramuscular Tomorrow-1000    Assessment/Plan Type II diabetes Hypertension Daughter is translating she speaks only [place]  SBO - first SBO ~ 10 years ago - [place] Prior abdominal surgery " tubal ligation, but has large midline scar and RLQ scar  In [place] ~40 years ago  FEN:  IV fluids/NPO ID:  None DVT:  Heparin Follow up:  TBD   Plan: The was removed and I will put her on clear liquids, continue to mobilize.  LOS: 4 days    Shahan Starks 07/22/2018 [phone]

## 2018-07-22 NOTE — Progress Notes (Signed)
Patient's daughter had NG tube clamped from 2000 yesterday until now. Patient slept well with no complaints. NG returned to [facility].

## 2018-07-22 NOTE — Progress Notes (Signed)
PROGRESS NOTE    Noelle Sease  OEV:[id number] DOB: 1941-01-18 DOA: 07/18/2018 PCP: Patient, No Pcp Per  Brief Narrative:  78 year old [place] female, visiting daughter in [place] and stay prolonged due to travel restrictions from current COVID pandemic, PMH of DM 2, HTN, prior SBO for which she had surgery 2 years ago, presented to [facility] ED 4/17 due to 4 days history of abdominal pain, nausea and vomiting and found to have high-grade mid to distal small bowel obstruction on CT abdomen.  Treating conservatively with NPO, IV fluids and NG tube.  General surgery consulted.  Slowly improving.  Assessment & Plan:   Active Problems:   SBO (small bowel obstruction) (HCC)   Small bowel obstruction:  1. Possibly due to adhesions from prior laparotomy.  General surgery consulted. NG TUBE removed. IV Fluids.  Clears today and advance as tolerated.    Hypertension:  Well controlled.    Type 2 DM Resume SSI while hospitalized.    Hypokalemia; replaced.    Asymptomatic bacteriuria:  No new complaints.     DVT prophylaxis: heparin.  Code Status: (Full. Family Communication: daughter at bedside.Disposition Plan: pending clinical improvement.    Consultants:   Surgery.   Procedures: none.   Antimicrobials: none.   Subjective: Distention improving. No nausea, vomiting. 2 bm TODAY.   Objective: Vitals:   07/21/18 1246 07/21/18 2148 07/22/18 0000 07/22/18 0532  BP: (!) 147/65 135/68  (!) 126/58  Pulse: 83 84  72  Resp:    19  Temp: 98.5 F (36.9 C) 98.2 F (36.8 C)  98 F (36.7 C)  TempSrc:  Oral  Oral  SpO2:  (!) 89% 92% 94%  Weight:      Height:        Intake/Output Summary (Last 24 hours) at 07/22/2018 1644 Last data filed at 07/22/2018 0700 Gross per 24 hour  Intake 1455.28 ml  Output 350 ml  Net 1105.28 ml   Filed Weights   07/18/18 0957 07/18/18 1828  Weight: 73.9 kg 73.6 kg    Examination:  General exam: Appears calm and comfortable  Respiratory  system: Clear to auscultation. Respiratory effort normal. Cardiovascular system: S1 & S2 heard, RRR. No JVD, murmurs, rubs, gallops or clicks. No pedal edema. Gastrointestinal system: Abdomen is nondistended, soft and nontender. No organomegaly or masses felt. Normal bowel sounds heard. Central nervous system: Alert and oriented. No focal neurological deficits. Extremities: Symmetric 5 x 5 power. Skin: No rashes, lesions or ulcers Psychiatry: Mood & affect appropriate.     Data Reviewed: I have personally reviewed following labs and imaging studies  CBC: Recent Labs  Lab 07/18/18 1025 07/18/18 2028 07/19/18 0220  WBC 8.0 7.3 6.8  NEUTROABS 5.9  --   --   HGB 13.9 13.7 13.0  HCT 42.6 42.9 40.8  MCV 86.9 85.6 85.5  PLT 235 233 829   Basic Metabolic Panel: Recent Labs  Lab 07/18/18 1025 07/18/18 2028 07/19/18 0220 07/20/18 0240 07/21/18 0236  NA 134*  --  137 140 140  K 3.7  --  3.4* 3.5 3.6  CL 95*  --  96* 100 99  CO2 27  --  29 29 32  GLUCOSE 148*  --  110* 131* 111*  BUN 21  --  18 20 18   CREATININE 0.70 0.77 0.73 0.64 0.84  CALCIUM 9.2  --  9.2 8.8* 8.7*  MG  --   --  2.4  --  2.3   GFR: Estimated Creatinine Clearance: 50.6 mL/min (  by C-G formula based on SCr of 0.84 mg/dL). Liver Function Tests: Recent Labs  Lab 07/18/18 1025  AST 19  ALT 14  ALKPHOS 53  BILITOT 1.5*  PROT 7.4  ALBUMIN 4.0   Recent Labs  Lab 07/18/18 1025  LIPASE 21   No results for input(s): AMMONIA in the last 168 hours. Coagulation Profile: No results for input(s): INR, PROTIME in the last 168 hours. Cardiac Enzymes: No results for input(s): CKTOTAL, CKMB, CKMBINDEX, TROPONINI in the last 168 hours. BNP (last 3 results) No results for input(s): PROBNP in the last 8760 hours. HbA1C: No results for input(s): HGBA1C in the last 72 hours. CBG: Recent Labs  Lab 07/22/18 0000 07/22/18 0425 07/22/18 0753 07/22/18 1138 07/22/18 1633  GLUCAP 109* 90 117* 113* 118*   Lipid  Profile: No results for input(s): CHOL, HDL, LDLCALC, TRIG, CHOLHDL, LDLDIRECT in the last 72 hours. Thyroid Function Tests: No results for input(s): TSH, T4TOTAL, FREET4, T3FREE, THYROIDAB in the last 72 hours. Anemia Panel: No results for input(s): VITAMINB12, FOLATE, FERRITIN, TIBC, IRON, RETICCTPCT in the last 72 hours. Sepsis Labs: No results for input(s): PROCALCITON, LATICACIDVEN in the last 168 hours.  Recent Results (from the past 240 hour(s))  Urine culture     Status: Abnormal   Collection Time: 07/18/18 12:48 PM  Result Value Ref Range Status   Specimen Description   Final    URINE, CLEAN CATCH Performed at [facility], [address] [place], [place] [address]    Special Requests   Final    NONE Performed at [facility], [address] [place], [place] [address]    Culture MULTIPLE SPECIES PRESENT, SUGGEST RECOLLECTION (A)  Final   Report Status 07/19/2018 FINAL  Final         Radiology Studies: Dg Abd 1 View  Result Date: 07/21/2018 CLINICAL DATA:  Acute generalized abdominal pain and distention. EXAM: ABDOMEN - 1 VIEW COMPARISON:  Radiograph of July 20, 2018. FINDINGS: The bowel gas pattern is normal. Nasogastric tube tip is seen in proximal stomach. No radio-opaque calculi or other significant radiographic abnormality are seen. IMPRESSION: No evidence of bowel obstruction or ileus. Electronically Signed   By: Marijo Conception M.D.   On: 07/21/2018 11:22        Scheduled Meds: . chlorhexidine  15 mL Mouth Rinse BID  . cloNIDine  0.1 mg Transdermal Q Fri-1800  . heparin  5,000 Units Subcutaneous Q8H  . insulin aspart  0-9 Units Subcutaneous Q4H  . mouth rinse  15 mL Mouth Rinse q12n4p  . pantoprazole (PROTONIX) IV  40 mg Intravenous Q24H  . pneumococcal 23 valent vaccine  0.5 mL Intramuscular Tomorrow-1000   Continuous Infusions:   LOS: 4 days    Time spent: 38 minutes.     Hosie Poisson, MD [facility] Pager  [phone]  If 7PM-7AM, please contact night-coverage [email or web address] Password [facility] 07/22/2018, 4:44 PM

## 2018-07-23 DIAGNOSIS — I10 Essential (primary) hypertension: Secondary | ICD-10-CM

## 2018-07-23 DIAGNOSIS — E1122 Type 2 diabetes mellitus with diabetic chronic kidney disease: Secondary | ICD-10-CM

## 2018-07-23 DIAGNOSIS — Z992 Dependence on renal dialysis: Secondary | ICD-10-CM

## 2018-07-23 DIAGNOSIS — N186 End stage renal disease: Secondary | ICD-10-CM

## 2018-07-23 LAB — GLUCOSE, CAPILLARY
Glucose-Capillary: 100 mg/dL — ABNORMAL HIGH (ref 70–99)
Glucose-Capillary: 103 mg/dL — ABNORMAL HIGH (ref 70–99)
Glucose-Capillary: 113 mg/dL — ABNORMAL HIGH (ref 70–99)
Glucose-Capillary: 139 mg/dL — ABNORMAL HIGH (ref 70–99)
Glucose-Capillary: 91 mg/dL (ref 70–99)

## 2018-07-23 MED ORDER — PANTOPRAZOLE SODIUM 40 MG PO TBEC
40.0000 mg | DELAYED_RELEASE_TABLET | Freq: Every day | ORAL | Status: DC
  Administered 2018-07-23: 40 mg via ORAL
  Filled 2018-07-23: qty 1

## 2018-07-23 NOTE — Discharge Summary (Addendum)
Physician Discharge Summary  Erin Burke GEX:[id number] DOB: 1940/05/04 DOA: 07/18/2018  PCP: Patient, No Pcp Per  Admit date: 07/18/2018 Discharge date: 07/23/2018  Time spent: 35 minutes  Recommendations for Outpatient Follow-up:  1. Repeat basic metabolic panel to follow electrolytes and renal function 2. outpatient patient follow up of her CBGs/A1c, further Adjustments on her hypoglycemic regimen as needed. 3. Reassess BP and adjust antihypertension regimen as required.    Discharge Diagnoses:  Active Problems:   SBO (small bowel obstruction) (HCC)   Type 2 diabetes mellitus without long-term current use of insulin (HCC)   Benign essential HTN Hypokalemia Allergic rhinitis.  Discharge Condition: Stable and improved.  Patient discharged home with instruction to follow-up with PCP in 2 weeks.  Diet recommendation: Soft heart healthy/modified carbohydrate diet.   Filed Weights   07/18/18 0957 07/18/18 1828  Weight: 73.9 kg 73.6 kg    History of present illness:  As per H&P written by Dr. Marthenia Rolling on 07/18/18 78 year old female past medical history significant for diabetes mellitus, hypertension and prior history of small bowel obstruction about 2 years ago for which the patient underwent surgery.  Patient presents with abdominal pain that started 3 days ago, gradually worsening, with associated nausea and vomiting.  Last bowel movement was 4 days ago, and no flatus.  CT scan of the abdomen and pelvis done with contrast revealed small bowel obstruction with a transition point in the ventral abdomen.  No headache, no neck pain, no chest pain, no shortness of breath, no fevers chills, no urinary symptoms and no joint pain.  Patient be admitted for further assessment and management.  ED Course: Patient was seen in the ER, as CT revealed likely small bowel obstruction.  NG to low suction.  Surgical team was consulted by the ER team.  Hospital Course:  1-SBO -Appears to be  secondary to adhesions -Improving/resolved with conservative management -At discharge tolerating full liquid/Soft diet, good bowel movements. -no nausea, no abd pain, no vomiting -advise to maintain adequate hydration and to use miralax as needed to prevent constipation.  2-HTN -stable and well controlled -continue current antihypertensive regimen   3-hypokalemia -due to poor oral intake and use of NGT -replaced and stable at discharge -repeat BMET to follow electrolytes trend  4-type 2 diabetes -resume home oral hypoglycemic regimen -low carb diet recommended -outpatient follow up of her CBG's and A1C recommended.  5-seasonal allergic dry eyes syndrome -stable currently -continue outpatient use of optical refreshing solution  Procedures:  See below for x-ray reports   Consultations:  General surgery  Discharge Exam: Vitals:   07/22/18 2009 07/23/18 0453  BP: 135/67 132/71  Pulse: 75 81  Resp: 18   Temp: 98.2 F (36.8 C) 98.3 F (36.8 C)  SpO2: 95% 98%    General: Afebrile, no chest pain, no shortness of breath, no nausea, no vomiting, denies abdominal pain, multiple bowel movements in the last 48 hours and tolerating full liquid diet. Cardiovascular: S1 and S2, no rubs, no gallops, no murmurs, no JVD. Respiratory: Clear to auscultation bilaterally; good O2 sat on room air. Abdomen: Soft, nondistended, positive bowel sounds, no guarding, Extremities: No edema, no cyanosis.  Discharge Instructions   Discharge Instructions    Diet - low sodium heart healthy   Complete by:  As directed    Diet Carb Modified   Complete by:  As directed    Discharge instructions   Complete by:  As directed    Maintain adequate hydration (as discussed) Advance diet  slowly starting with soft diet Use as needed over-the-counter MiraLAX to maintain good bowel movements habits. Follow up with [facility] center as instructed to establish care with PCP.     Allergies as of  07/23/2018   No Known Allergies     Medication List    TAKE these medications   atenolol 50 MG tablet Commonly known as:  TENORMIN Take 50 mg by mouth.   CLEAR EYES OP Place 1 drop into both eyes daily as needed (seasonal allergies).   PRESCRIPTION MEDICATION Take 1 tablet by mouth daily. Eucreas 50/850 - vildagliptin and metformin      No Known Allergies Follow-up Information    [facility] Follow up on 07/29/2018.   Why:  2:50 pm with Juluis Mire NP Contact information: [address] [place] [address] [phone]          The results of significant diagnostics from this hospitalization (including imaging, microbiology, ancillary and laboratory) are listed below for reference.    Significant Diagnostic Studies: Dg Abd 1 View  Result Date: 07/21/2018 CLINICAL DATA:  Acute generalized abdominal pain and distention. EXAM: ABDOMEN - 1 VIEW COMPARISON:  Radiograph of July 20, 2018. FINDINGS: The bowel gas pattern is normal. Nasogastric tube tip is seen in proximal stomach. No radio-opaque calculi or other significant radiographic abnormality are seen. IMPRESSION: No evidence of bowel obstruction or ileus. Electronically Signed   By: Marijo Conception M.D.   On: 07/21/2018 11:22   Dg Abd 1 View  Result Date: 07/20/2018 CLINICAL DATA:  SBO EXAM: ABDOMEN - 1 VIEW COMPARISON:  Radiograph 07/19/2018 FINDINGS: NG tube extends the stomach. No dilated large or small bowel. Interval decrease in bowel distension seen on comparison exam. Gas in the rectum IMPRESSION: 1. Decrease in bowel distention. Gas in the rectum. Nonobstructive bowel gas pattern. 2. NG tube in stomach. Electronically Signed   By: Suzy Bouchard M.D.   On: 07/20/2018 07:51   Ct Abdomen Pelvis W Contrast  Result Date: 07/18/2018 CLINICAL DATA:  Bowel obstruction, high-grade. EXAM: CT ABDOMEN AND PELVIS WITH CONTRAST TECHNIQUE: Multidetector CT imaging of the  abdomen and pelvis was performed using the standard protocol following bolus administration of intravenous contrast. CONTRAST:  184mL OMNIPAQUE IOHEXOL 300 MG/ML  SOLN COMPARISON:  None. FINDINGS: Lower chest: Mild dependent atelectasis is present at the lung bases bilaterally. The heart size is normal. No significant pleural or pericardial effusion is present. Coronary artery calcifications are present. Hepatobiliary: Mild intrahepatic biliary dilation is present. A cyst is present posteriorly in the right lobe of the liver measuring 10 mm. Common bile duct is within normal limits. No other parenchymal lesions are present. The gallbladder is normal. Pancreas: Unremarkable. No pancreatic ductal dilatation or surrounding inflammatory changes. Spleen: Normal in size without focal abnormality. Adrenals/Urinary Tract: Adrenal glands are normal bilaterally. Kidneys and ureters are within normal limits. The urinary bladder is normal. Stomach/Bowel: The stomach and duodenum are within normal limits. The proximal small bowel is moderately dilated. There is a transition point just left of midline in the ventral abdomen. No focal mass lesion is present. There is fecalized material at the transition point. There is also some free fluid. No free air is present. The more distal small bowel is decompressed. The ascending and transverse colon are normal. The descending and sigmoid colon are normal. Vascular/Lymphatic: Atherosclerotic calcifications are present in the aorta and branch vessels without aneurysm. No significant adenopathy is present. Reproductive: Uterus and bilateral adnexa  are unremarkable. Other: No other free fluid or free air is present. There is no ventral hernia. Musculoskeletal: Grade 1 anterolisthesis is present at L4-5. AP alignment is otherwise anatomic. A vacuum disc is present at L5-S1. IMPRESSION: 1. Small bowel obstruction with a transition point in the ventral abdomen. No mass lesion is present. This  may be due to adhesions. 2. Fluid adjacent to the transition point is likely inflammatory. 3. Mild intrahepatic biliary dilation without an obstructing lesion. This may be within normal limits. 4. Degenerative changes in the lower lumbar spine. Electronically Signed   By: San Morelle M.D.   On: 07/18/2018 12:13   Dg Chest Portable 1 View  Result Date: 07/18/2018 CLINICAL DATA:  Nasogastric placement for small-bowel obstruction. EXAM: PORTABLE CHEST 1 VIEW COMPARISON:  CT same day FINDINGS: Nasogastric tube enters the stomach with its tip in the fundus. Dilated fluid and air-filled loops of small bowel are partially depicted. Active cardiopulmonary disease. Heart size is normal. Lungs are clear. IMPRESSION: Nasogastric tube enters the stomach with its tip in the fundus. Electronically Signed   By: Nelson Chimes M.D.   On: 07/18/2018 13:41   Dg Abd Portable 1v-small Bowel Obstruction Protocol-initial, 8 Hr Delay  Result Date: 07/19/2018 CLINICAL DATA:  Small bowel obstruction. EXAM: PORTABLE ABDOMEN - 1 VIEW COMPARISON:  07/18/2018 FINDINGS: An enteric tube terminates over the proximal stomach, unchanged. There is persistent moderate dilatation of multiple small bowel loops in the upper and mid abdomen, similar to the prior study. Small volume stool and gas are present in nondilated colon. Gas is present in the rectum. No gross intraperitoneal free air is identified on this supine study. No acute osseous abnormality is seen. IMPRESSION: Unchanged small bowel dilatation consistent with obstruction. Electronically Signed   By: Logan Bores M.D.   On: 07/19/2018 08:46   Dg Abd Portable 1v-small Bowel Protocol-position Verification  Result Date: 07/18/2018 CLINICAL DATA:  Check gastric catheter placement EXAM: PORTABLE ABDOMEN - 1 VIEW COMPARISON:  CT from earlier in the same day. FINDINGS: Gastric catheter is now seen within the stomach. Scattered small bowel dilatation is again noted. No free air is  seen. IMPRESSION: Gastric catheter within the stomach. Electronically Signed   By: Inez Catalina M.D.   On: 07/18/2018 22:09    Microbiology: Recent Results (from the past 240 hour(s))  Urine culture     Status: Abnormal   Collection Time: 07/18/18 12:48 PM  Result Value Ref Range Status   Specimen Description   Final    URINE, CLEAN CATCH Performed at [facility], [address] [place], [place] [address]    Special Requests   Final    NONE Performed at [facility], [address] [place], [place] [address]    Culture MULTIPLE SPECIES PRESENT, SUGGEST RECOLLECTION (A)  Final   Report Status 07/19/2018 FINAL  Final     Labs: Basic Metabolic Panel: Recent Labs  Lab 07/18/18 1025 07/18/18 2028 07/19/18 0220 07/20/18 0240 07/21/18 0236  NA 134*  --  137 140 140  K 3.7  --  3.4* 3.5 3.6  CL 95*  --  96* 100 99  CO2 27  --  29 29 32  GLUCOSE 148*  --  110* 131* 111*  BUN 21  --  18 20 18   CREATININE 0.70 0.77 0.73 0.64 0.84  CALCIUM 9.2  --  9.2 8.8* 8.7*  MG  --   --  2.4  --  2.3   Liver Function Tests: Recent Labs  Lab 07/18/18 1025  AST 19  ALT 14  ALKPHOS 53  BILITOT 1.5*  PROT 7.4  ALBUMIN 4.0   Recent Labs  Lab 07/18/18 1025  LIPASE 21   CBC: Recent Labs  Lab 07/18/18 1025 07/18/18 2028 07/19/18 0220  WBC 8.0 7.3 6.8  NEUTROABS 5.9  --   --   HGB 13.9 13.7 13.0  HCT 42.6 42.9 40.8  MCV 86.9 85.6 85.5  PLT 235 233 232   CBG: Recent Labs  Lab 07/22/18 1633 07/22/18 2005 07/23/18 0005 07/23/18 0500 07/23/18 0751  GLUCAP 118* 140* 91 100* 103*    Signed:  Barton Dubois MD.  [facility] 07/23/2018, 10:57 AM

## 2018-07-23 NOTE — Progress Notes (Signed)
Erin Burke to be D/C'd Home per MD order.  Discussed with the patient and all questions fully answered.  VSS, Skin clean, dry and intact without evidence of skin break down, no evidence of skin tears noted. IV catheter discontinued intact. Site without signs and symptoms of complications. Dressing and pressure applied.  An After Visit Summary was printed and given to the patient. Patient received prescription.  D/c education completed with patient/family including follow up instructions, medication list, d/c activities limitations if indicated, with other d/c instructions as indicated by MD - patient able to verbalize understanding, all questions fully answered.   Patient instructed to return to ED, call 911, or call MD for any changes in condition.   Patient escorted via [place], and D/C home via private auto.  Luci Bank 07/23/2018 4:31 PM

## 2018-07-23 NOTE — TOC Transition Note (Signed)
Transition of Care [facility]) - CM/SW Discharge Note   Patient Details  Name: Erin Burke MRN: [id number] Date of Birth: 10-27-1940  Transition of Care [facility] CM/SW Contact:  Sharin Mons, RN Phone Number: 07/23/2018, 10:59 AM   Clinical Narrative:    Transition to home with daughter today, self care. Pt f/u with [facility] for hospital follow up, noted on AVS. Pt has transportation to home     Barriers to Discharge: Continued Medical Work up   Patient Goals and CMS Choice Patient states their goals for this hospitalization and ongoing recovery are:: to get home per daughter    Choice offered to / list presented to : NA  Discharge Placement                       Discharge Plan and Services   Discharge Planning Services: CM Consult, Follow-up appt scheduled Post Acute Care Choice: NA          DME Arranged: N/A DME Agency: NA HH Arranged: NA HH Agency: NA   Social Determinants of Health (SDOH) Interventions     Readmission Risk Interventions No flowsheet data found.

## 2018-07-23 NOTE — Discharge Instructions (Signed)
Bowel Obstruction °A bowel obstruction means that something is blocking the small or large bowel. The bowel is also called the intestine. It is the long tube that connects the stomach to the opening of the butt (anus). When something blocks the bowel, food and fluids cannot pass through like normal. This condition needs to be treated. Treatment depends on the cause of the problem and how bad the problem is. °What are the causes? °Common causes of this condition include: °· Scar tissue (adhesions) from past surgery or from high-energy X-rays (radiation). °· Recent surgery in the belly. This affects how food moves in the bowel. °· Some diseases, such as: °? Irritation of the lining of the digestive tract (Crohn's disease). °? Irritation of small pouches in the bowel (diverticulitis). °· Growths or tumors. °· A bulging organ (hernia). °· Twisting of the bowel (volvulus). °· A foreign body. °· Slipping of a part of the bowel into another part (intussusception). °What are the signs or symptoms? °Symptoms of this condition include: °· Pain in the belly. °· Feeling sick to your stomach (nauseous). °· Throwing up (vomiting). °· Bloating in the belly. °· Being unable to pass gas. °· Trouble pooping (constipation). °· Watery poop (diarrhea). °· A lot of belching. °How is this diagnosed? °This condition may be diagnosed based on: °· A physical exam. °· Medical history. °· Imaging tests, such as X-ray or CT scan. °· Blood tests. °· Urine tests. °How is this treated? °Treatment for this condition may include: °· Fluids and pain medicines that are given through an IV tube. Your doctor may tell you not to eat or drink if you feel sick to your stomach and are throwing up. °· Eating a clear liquid diet for a few days. °· Putting a small tube (nasogastric tube) into the stomach. This will help with pain, discomfort, and nausea by removing blocked air and fluids from the stomach. °· Surgery. This may be needed if other treatments do  not work. °Follow these instructions at home: °Medicines °· Take over-the-counter and prescription medicines only as told by your doctor. °· If you were prescribed an antibiotic medicine, take it as told by your doctor. Do not stop taking the antibiotic even if you start to feel better. °General instructions °· Follow your diet as told by your doctor. You may need to: °? Only drink clear liquids until you start to get better. °? Avoid solid foods. °· Return to your normal activities as told by your doctor. Ask your doctor what activities are safe for you. °· Do not sit for a long time without moving. Get up to take short walks every 1-2 hours. This is important. Ask for help if you feel weak or unsteady. °· Keep all follow-up visits as told by your doctor. This is important. °How is this prevented? °After having a bowel obstruction, you may be more likely to have another. You can do some things to stop it from happening again. °· If you have a long-term (chronic) disease, contact your doctor if you see changes or problems. °· Take steps to prevent or treat trouble pooping. Your doctor may ask that you: °? Drink enough fluid to keep your pee (urine) pale yellow. °? Take over-the-counter or prescription medicines. °? Eat foods that are high in fiber. These include beans, whole grains, and fresh fruits and vegetables. °? Limit foods that are high in fat and sugar. These include fried or sweet foods. °· Stay active. Ask your doctor which exercises are   safe for you.  Avoid stress.  Eat three small meals and three small snacks each day.  Work with a Publishing rights manager (dietitian) to make a meal plan that works for you.  Do not use any products that contain nicotine or tobacco, such as cigarettes and e-cigarettes. If you need help quitting, ask your doctor. Contact a doctor if:  You have a fever.  You have chills. Get help right away if:  You have pain or cramps that get worse.  You throw up blood.  You are  sick to your stomach.  You cannot stop throwing up.  You cannot drink fluids.  You feel mixed up (confused).  You feel very thirsty (dehydrated).  Your belly gets more bloated.  You feel weak or you pass out (faint). Summary  A bowel obstruction means that something is blocking the small or large bowel.  Treatment may include IV fluids and pain medicine. You may also have a clear liquid diet, a small tube in your stomach, or surgery.  Drink clear liquids and avoid solid foods until you get better. This information is not intended to replace advice given to you by your health care provider. Make sure you discuss any questions you have with your health care provider. Document Released: 04/26/2004 Document Revised: 07/31/2017 Document Reviewed: 07/31/2017 Elsevier Interactive Patient Education  2019 [place].   Full Liquid Diet for the next 24-48 hours, then go to soft diet for the next week after this.   A full liquid diet refers to fluids and foods that are liquid or will become liquid at room temperature. This diet should only be used for a short period of time to help you recover from illness or surgery. Your health care provider or dietitian will help you determine when it is safe to eat regular foods. What are tips for following this plan?     Reading food labels  Check food labels of nutrition shakes for the amount of protein. Look for nutrition shakes that have at least 8-10 grams of protein in each serving.  Look for drinks, such as milks and juices, that are "fortified" or "enriched." This means that vitamins and minerals have been added. Shopping  Buy premade nutritional shakes to keep on hand.  To vary your choices, buy different flavors of milks and shakes. Meal planning  Choose flavors and foods that you enjoy.  To make sure you get enough energy from food (calories): ? Eat 3 full liquid meals each day. Have a liquid snack between each meal. ? Drink  6-8 ounces (177-237 ml) of a nutrition supplement shake with meals or as snacks. ? Add protein powder, powdered milk, milk, or yogurt to shakes to increase the amount of protein.  Drink at least one serving a day of citrus fruit juice or fruit juice that has vitamin C added. General guidelines  Before starting the full-liquid diet, check with your health care provider to know what foods you should avoid. These may include full-fat or high-fiber liquids.  You may have any liquid or food that becomes a liquid at room temperature. The food is considered a liquid if it can be poured off a spoon at room temperature.  Do not drink alcohol unless approved by your health care provider.  This diet gives you most of the nutrients that you need for energy, but you may not get enough of certain vitamins, minerals, and fiber. Make sure to talk to your health care provider or dietitian about: ?  How many calories you need to eat get day. ? How much fluid you should have each day. ? Taking a multivitamin or a nutritional supplement. What foods are allowed? The items listed may not be a complete list. Talk with your dietitian about what dietary choices are best for you. Grains Thin hot cereal, such as cream of wheat. Soft-cooked pasta or rice pured in soup. Vegetables Pulp-free tomato or vegetable juice. Vegetables pured in soup. Fruits Fruit juice without pulp. Strained fruit pures (seeds and skins removed). Meats and other protein foods Beef, chicken, and fish broths. Powdered protein supplements. Dairy Milk and milk-based beverages, including milk shakes and instant breakfast mixes. Smooth yogurt. Pured cottage cheese. Beverages Water. Coffee and tea (caffeinated or decaffeinated). Cocoa. Liquid nutritional supplements. Soft drinks. Nondairy milks, such as almond, coconut, rice, or soy milk. Fats and oils Melted margarine and butter. Cream. Canola, almond, avocado, corn, grapeseed, sunflower,  and sesame oils. Gravy. Sweets and desserts Custard. Pudding. Flavored gelatin. Smooth ice cream (without nuts or candy pieces). Sherbet. Popsicles. [place] ice. Pudding pops. Seasoning and other foods Salt and pepper. Spices. Cocoa powder. Vinegar. Ketchup. Yellow mustard. Smooth sauces, such as [place], cheese sauce, or white sauce. Soy sauce. Cream soups. Strained soups. Syrup. Honey. Jelly (without fruit pieces). What foods are not allowed? The items listed may not be a complete list. Talk with your dietitian about what dietary choices are best for you. Grains Whole grains. Pasta. Rice. Cold cereal. Bread. Crackers. Vegetables All whole fresh, frozen, or canned vegetables. Fruits All whole fresh, frozen, or canned fruits. Meats and other protein foods All cuts of meat, poultry, and fish. Eggs. Tofu and soy protein. Nuts and nut butters. Lunch meat. Sausage. Dairy Hard cheese. Yogurt with fruit chunks. Fats and oils Coconut oil. Palm oil. Lard. Cold butter. Sweets and desserts Ice cream or other frozen desserts that have any solids in them or on top, such as nuts, chocolate chips, and pieces of cookies. Cakes. Cookies. Candy. Seasoning and other foods Stone-ground mustards. Soups with chunks or pieces. Summary  A full liquid diet refers to fluids and foods that are liquid or will become liquid at room temperature.  This diet should only be used for a short period of time to help you recover from illness or surgery. Ask your health care provider or dietitian when it is safe for you to eat regular foods.  To make sure you get enough calories and nutrients, eat 3 meals each day with snacks between. Drink premade nutrition supplement shakes or add protein powder to homemade shakes. Take a vitamin and mineral supplement as told by your health care provider. This information is not intended to replace advice given to you by your health care provider. Make sure you discuss any questions  you have with your health care provider. Document Released: 03/19/2005 Document Revised: 05/02/2016 Document Reviewed: 05/02/2016 Elsevier Interactive Patient Education  2019 [place]..   Soft-Food Eating Plan for a week before adding fiber to her diet.  A soft-food eating plan includes foods that are safe and easy to chew and swallow. Your health care provider or dietitian can help you find foods and flavors that fit into this plan. Follow this plan until your health care provider or dietitian says it is safe to start eating other foods and food textures. What are tips for following this plan? General guidelines   Take small bites of food, or cut food into pieces about  inch or smaller. Bite-sized pieces of  food are easier to chew and swallow.  Eat moist foods. Avoid overly dry foods.  Avoid foods that: ? Are difficult to swallow, such as dry, chunky, crispy, or sticky foods. ? Are difficult to chew, such as hard, tough, or stringy foods. ? Contain nuts, seeds, or fruits.  Follow instructions from your dietitian about the types of liquids that are safe for you to swallow. You may be allowed to have: ? Thick liquids only. This includes only liquids that are thicker than honey. ? Thin and thick liquids. This includes all beverages and foods that become liquid at room temperature.  To make thick liquids: ? Purchase a commercial liquid thickening powder. These are available at grocery stores and pharmacies. ? Mix the thickener into liquids according to instructions on the label. ? Purchase ready-made thickened liquids. ? Thicken soup by pureeing, straining to remove chunks, and adding flour, potato flakes, or corn starch. ? Add commercial thickener to foods that become liquid at room temperature, such as milk shakes, yogurt, ice cream, gelatin, and sherbet.  Ask your health care provider whether you need to take a fiber supplement. Cooking  Cook meats so they stay tender and  moist. Use methods like braising, stewing, or baking in liquid.  Cook vegetables and fruit until they are soft enough to be mashed with a fork.  Peel soft, fresh fruits such as peaches, nectarines, and melons.  When making soup, make sure chunks of meat and vegetables are smaller than  inch.  Reheat leftover foods slowly so that a tough crust does not form. What foods are allowed? The items listed below may not be a complete list. Talk with your dietitian about what dietary choices are best for you. Grains Breads, muffins, pancakes, or waffles moistened with syrup, jelly, or butter. Dry cereals well-moistened with milk. Moist, cooked cereals. Well-cooked pasta and rice. Vegetables All soft-cooked vegetables. Shredded lettuce. Fruits All canned and cooked fruits. Soft, peeled fresh fruits. Strawberries. Dairy Milk. Cream. Yogurt. Cottage cheese. Soft cheese without the rind. Meats and other protein foods Tender, moist ground meat, poultry, or fish. Meat cooked in gravy or sauces. Eggs. Sweets and desserts Ice cream. Milk shakes. Sherbet. Pudding. Fats and oils Butter. Margarine. Olive, canola, sunflower, and grapeseed oil. Smooth salad dressing. Smooth cream cheese. Mayonnaise. Gravy. What foods are not allowed? The items listed bemay not be a complete list. Talk with your dietitian about what dietary choices are best for you. Grains Coarse or dry cereals, such as bran, granola, and shredded wheat. Tough or chewy crusty breads, such as [place] bread or baguettes. Breads with nuts, seeds, or fruit. Vegetables All raw vegetables. Cooked corn. Cooked vegetables that are tough or stringy. Tough, crisp, fried potatoes and potato skins. Fruits Fresh fruits with skins or seeds, or both, such as apples, pears, and grapes. Stringy, high-pulp fruits, such as papaya, pineapple, coconut, and mango. Fruit leather and all dried fruit. Dairy Yogurt with nuts or coconut. Meats and other protein  foods Hard, dry sausages. Dry meat, poultry, or fish. Meats with gristle. Fish with bones. Fried meat or fish. Lunch meat and hotdogs. Nuts and seeds. Chunky peanut butter or other nut butters. Sweets and desserts Cakes or cookies that are very dry or chewy. Desserts with dried fruit, nuts, or coconut. Fried pastries. Very rich pastries. Fats and oils Cream cheese with fruit or nuts. Salad dressings with seeds or chunks. Summary  A soft-food eating plan includes foods that are safe and easy to swallow. Generally,  the foods should be soft enough to be mashed with a fork.  Avoid foods that are dry, hard to chew, crunchy, sticky, stringy, or crispy.  Ask your health care provider whether you need to thicken your liquids and if you need to take a fiber supplement. This information is not intended to replace advice given to you by your health care provider. Make sure you discuss any questions you have with your health care provider. Document Released: 06/26/2007 Document Revised: 05/22/2016 Document Reviewed: 05/22/2016 Elsevier Interactive Patient Education  2019 [employer].

## 2018-07-23 NOTE — Progress Notes (Signed)
    RS:WNIOEVOJJ pain  Subjective: She looks great this AM.  Tolerating clears well, BM yesterday and today.  Walking without issue.    Objective: Vital signs in last 24 hours: Temp:  [98.2 F (36.8 C)-98.3 F (36.8 C)] 98.3 F (36.8 C) (04/22 0453) Pulse Rate:  [75-81] 81 (04/22 0453) Resp:  [18] 18 (04/21 2009) BP: (132-135)/(67-71) 132/71 (04/22 0453) SpO2:  [95 %-98 %] 98 % (04/22 0453) Last BM Date: 07/22/18 I/O shows she voided x 1 yesterday, nothing else,  BM this AM Afebrile, VSS No labs/film this AM  Intake/Output from previous day: No intake/output data recorded. Intake/Output this shift: No intake/output data recorded.  General appearance: alert, cooperative and no distress GI: soft, non-tender; bowel sounds normal; no masses,  no organomegaly  Lab Results:  No results for input(s): WBC, HGB, HCT, PLT in the last 72 hours.  BMET Recent Labs    07/21/18 0236  NA 140  K 3.6  CL 99  CO2 32  GLUCOSE 111*  BUN 18  CREATININE 0.84  CALCIUM 8.7*   PT/INR No results for input(s): LABPROT, INR in the last 72 hours.  Recent Labs  Lab 07/18/18 1025  AST 19  ALT 14  ALKPHOS 53  BILITOT 1.5*  PROT 7.4  ALBUMIN 4.0     Lipase     Component Value Date/Time   LIPASE 21 07/18/2018 1025     Medications: . chlorhexidine  15 mL Mouth Rinse BID  . cloNIDine  0.1 mg Transdermal Q Fri-1800  . heparin  5,000 Units Subcutaneous Q8H  . insulin aspart  0-9 Units Subcutaneous Q4H  . mouth rinse  15 mL Mouth Rinse q12n4p  . pantoprazole (PROTONIX) IV  40 mg Intravenous Q24H  . pneumococcal 23 valent vaccine  0.5 mL Intramuscular Tomorrow-1000     Assessment/Plan Type II diabetes Hypertension Daughter is translating she speaks only [place]  SBO - first SBO ~ 10 years ago - [place] Prior abdominal surgery " tubal ligation, but has large midline scar and RLQ scar In [place] ~40 years ago  FEN: IV fluids/NPO ID: None DVT: Heparin Follow up:  TBD   Plan:  Full liquids.  I would let her go home this PM if she does well on full liquids.  I talked with her daughter and told her she could stay on full liquids for a day or two before going to a soft diet.  She does not need to follow up with our service.  We will be available if needed. Please call if we can help.        LOS: 5 days    Arvine Clayburn 07/23/2018 [phone]

## 2018-07-29 ENCOUNTER — Other Ambulatory Visit: Payer: Self-pay

## 2018-07-29 ENCOUNTER — Encounter: Payer: Self-pay | Admitting: Primary Care

## 2018-07-29 ENCOUNTER — Ambulatory Visit: Payer: Self-pay | Attending: Primary Care | Admitting: Primary Care

## 2018-07-29 VITALS — BP 144/83 | HR 96 | Temp 98.7°F | Resp 18 | Ht 63.0 in | Wt 160.0 lb

## 2018-07-29 DIAGNOSIS — E1122 Type 2 diabetes mellitus with diabetic chronic kidney disease: Secondary | ICD-10-CM

## 2018-07-29 DIAGNOSIS — Z992 Dependence on renal dialysis: Secondary | ICD-10-CM

## 2018-07-29 DIAGNOSIS — I10 Essential (primary) hypertension: Secondary | ICD-10-CM

## 2018-07-29 DIAGNOSIS — K56609 Unspecified intestinal obstruction, unspecified as to partial versus complete obstruction: Secondary | ICD-10-CM

## 2018-07-29 DIAGNOSIS — Z09 Encounter for follow-up examination after completed treatment for conditions other than malignant neoplasm: Secondary | ICD-10-CM

## 2018-07-29 DIAGNOSIS — N186 End stage renal disease: Secondary | ICD-10-CM

## 2018-07-29 DIAGNOSIS — E782 Mixed hyperlipidemia: Secondary | ICD-10-CM

## 2018-07-29 LAB — POCT GLYCOSYLATED HEMOGLOBIN (HGB A1C): Hemoglobin A1C: 6.6 % — AB (ref 4.0–5.6)

## 2018-07-29 LAB — GLUCOSE, POCT (MANUAL RESULT ENTRY): POC Glucose: 110 mg/dl — AB (ref 70–99)

## 2018-07-29 MED ORDER — ATENOLOL 50 MG PO TABS: 50.0000 mg | ORAL_TABLET | Freq: Every day | ORAL | 30 refills | Status: AC

## 2018-07-29 MED ORDER — ATORVASTATIN CALCIUM 10 MG PO TABS: 10.0000 mg | ORAL_TABLET | Freq: Every day | ORAL | 3 refills | Status: AC

## 2018-07-29 MED ORDER — METFORMIN HCL 500 MG PO TABS: 500.0000 mg | ORAL_TABLET | Freq: Two times a day (BID) | ORAL | 3 refills | Status: AC

## 2018-07-29 MED ORDER — LISINOPRIL 5 MG PO TABS: 5.0000 mg | ORAL_TABLET | Freq: Every day | ORAL | 3 refills | Status: AC

## 2018-07-29 NOTE — Progress Notes (Signed)
New Patient Office Visit  Subjective:  Patient ID: Erin Burke, female    DOB: 1941/03/26  Age: 78 y.o. MRN: [id number]  CC:  Chief Complaint  Patient presents with  . Hospitalization Follow-up    HPI Erin Burke presents for hospital follow up and establish care. Past medical history significant for diabetes mellitus, hypertension and prior history of small bowel obstruction about 2 years ago for which the patient underwent surgery.Patient was seen in the ER,  CT revealed likely small bowel obstruction.  Chemistry reveals sodium of 134, potassium of 3.7, chloride 95, CO2 27, BUN of 21 with creatinine of 0.7.  CBC reveals WBC of 7.3, hemoglobin of 13.7, hematocrit of 40.9, MCV of 85.0 platelet count of 233. From ED above chemistry   Past Medical History:  Diagnosis Date  . Diabetes mellitus without complication ([place])   . Hypertension   . Small bowel obstruction [facility])     Past Surgical History:  Procedure Laterality Date  . ABDOMINAL SURGERY      History reviewed. No pertinent family history.  Social History   Socioeconomic History  . Marital status: Widowed    Spouse name: Not on file  . Number of children: Not on file  . Years of education: Not on file  . Highest education level: Not on file  Occupational History  . Not on file  Social Needs  . Financial resource strain: Not on file  . Food insecurity:    Worry: Not on file    Inability: Not on file  . Transportation needs:    Medical: Not on file    Non-medical: Not on file  Tobacco Use  . Smoking status: Passive Smoke Exposure - Never Smoker  . Smokeless tobacco: Never Used  Substance and Sexual Activity  . Alcohol use: Never    Frequency: Never  . Drug use: Never  . Sexual activity: Not Currently  Lifestyle  . Physical activity:    Days per week: Not on file    Minutes per session: Not on file  . Stress: Not on file  Relationships  . Social connections:    Talks on phone: Not on  file    Gets together: Not on file    Attends religious service: Not on file    Active member of club or organization: Not on file    Attends meetings of clubs or organizations: Not on file    Relationship status: Not on file  . Intimate partner violence:    Fear of current or ex partner: Not on file    Emotionally abused: Not on file    Physically abused: Not on file    Forced sexual activity: Not on file  Other Topics Concern  . Not on file  Social History Narrative  . Not on file    ROS Review of Systems  Constitutional: Negative.   HENT: Positive for congestion.   Eyes: Negative.   Respiratory: Negative.   Cardiovascular: Negative.   Gastrointestinal: Negative.   Endocrine: Negative.   Genitourinary: Negative.   Musculoskeletal: Negative.   Allergic/Immunologic: Negative.   Neurological: Negative.   Hematological: Negative.   Psychiatric/Behavioral: Negative.     Objective:   Today's Vitals: BP (!) 144/83 (BP Location: Left Arm, Patient Position: Sitting, Cuff Size: Large)   Pulse 96   Temp 98.7 F (37.1 C) (Oral)   Resp 18   Ht 5\' 3"  (1.6 m)   Wt 160 lb (72.6 kg)   SpO2 98%  BMI 28.34 kg/m   Physical Exam Vitals signs and nursing note reviewed.  Constitutional:      Appearance: She is obese.  HENT:     Head: Normocephalic and atraumatic.     Right Ear: Tympanic membrane normal.     Left Ear: Tympanic membrane normal.  Eyes:     Extraocular Movements: Extraocular movements intact.  Neck:     Musculoskeletal: Normal range of motion and neck supple.  Cardiovascular:     Rate and Rhythm: Normal rate and regular rhythm.  Pulmonary:     Effort: Pulmonary effort is normal.     Breath sounds: Normal breath sounds.  Abdominal:     General: Bowel sounds are normal. There is distension.     Palpations: Abdomen is soft.  Neurological:     Mental Status: She is alert.     Assessment & Plan:   Problem List Items Addressed This Visit    SBO (small  bowel obstruction) (HCC)   Type 2 diabetes mellitus with chronic kidney disease on chronic dialysis, without long-term current use of insulin (HCC) - Primary   Relevant Medications   metFORMIN (GLUCOPHAGE) 500 MG tablet   lisinopril (ZESTRIL) 5 MG tablet   atorvastatin (LIPITOR) 10 MG tablet   Other Relevant Orders   HgB A1c (Completed)   Glucose (CBG) (Completed)    Other Visit Diagnoses    Essential hypertension       Relevant Medications   atenolol (TENORMIN) 50 MG tablet   lisinopril (ZESTRIL) 5 MG tablet   atorvastatin (LIPITOR) 10 MG tablet   Other Relevant Orders   Comprehensive metabolic panel (Completed)   Hospital discharge follow-up       Mixed hyperlipidemia       Relevant Medications   atenolol (TENORMIN) 50 MG tablet   lisinopril (ZESTRIL) 5 MG tablet   atorvastatin (LIPITOR) 10 MG tablet   Other Relevant Orders   Lipid panel (Completed)      Outpatient Encounter Medications as of 07/29/2018  Medication Sig  . atenolol (TENORMIN) 50 MG tablet Take 1 tablet (50 mg total) by mouth daily for 30 days.  . Naphazoline HCl (CLEAR EYES OP) Place 1 drop into both eyes daily as needed (seasonal allergies).  Marland Kitchen PRESCRIPTION MEDICATION Take 1 tablet by mouth daily. Eucreas 50/850 - vildagliptin and metformin  . [DISCONTINUED] atenolol (TENORMIN) 50 MG tablet Take 50 mg by mouth.   Marland Kitchen atorvastatin (LIPITOR) 10 MG tablet Take 1 tablet (10 mg total) by mouth daily.  Marland Kitchen lisinopril (ZESTRIL) 5 MG tablet Take 1 tablet (5 mg total) by mouth daily.  . metFORMIN (GLUCOPHAGE) 500 MG tablet Take 1 tablet (500 mg total) by mouth 2 (two) times daily with a meal.   No facility-administered encounter medications on file as of 07/29/2018.    Annisa was seen today for hospitalization follow-up.  Diagnoses and all orders for this visit:  Type 2 diabetes mellitus with chronic kidney disease on chronic dialysis, without long-term current use of insulin ([place]) Recc-continuing oral hypoglycemic  regimen low carb diet recommended.  -     HgB A1c 6.6  -     Glucose (CBG) 110   Essential hypertension stable and well controlled continue current antihypertensive regimen  recc from hosp d/c labs for hypokalemia   -     Comprehensive metabolic panel  SBO (small bowel obstruction) (HCC)  secondary to adhesions Improved prior to d/c recc adequate hydration 64 oz of water daily . On Miralax for constipation  Hospital discharge follow-up For SBO   Mixed hyperlipidemia recc for DM Will place on a low dose statin and obtain  -     Lipid panel  Other orders -     metFORMIN (GLUCOPHAGE) 500 MG tablet; Take 1 tablet (500 mg total) by mouth 2 (two) times daily with a meal. -     atenolol (TENORMIN) 50 MG tablet; Take 1 tablet (50 mg total) by mouth daily for 30 days. -     lisinopril (ZESTRIL) 5 MG tablet; Take 1 tablet (5 mg total) by mouth daily. -     atorvastatin (LIPITOR) 10 MG tablet; Take 1 tablet (10 mg total) by mouth daily.   Follow-up: Return in about 3 months (around 10/28/2018) for Diabetes.   Kerin Perna, NP

## 2018-07-30 LAB — COMPREHENSIVE METABOLIC PANEL WITH GFR
ALT: 27 IU/L (ref 0–32)
AST: 23 IU/L (ref 0–40)
Albumin/Globulin Ratio: 1.9 (ref 1.2–2.2)
Albumin: 4.4 g/dL (ref 3.7–4.7)
Alkaline Phosphatase: 50 IU/L (ref 39–117)
BUN/Creatinine Ratio: 16 (ref 12–28)
BUN: 11 mg/dL (ref 8–27)
Bilirubin Total: 0.4 mg/dL (ref 0.0–1.2)
CO2: 25 mmol/L (ref 20–29)
Calcium: 10.2 mg/dL (ref 8.7–10.3)
Chloride: 99 mmol/L (ref 96–106)
Creatinine, Ser: 0.69 mg/dL (ref 0.57–1.00)
GFR calc Af Amer: 96 mL/min/1.73
GFR calc non Af Amer: 84 mL/min/1.73
Globulin, Total: 2.3 g/dL (ref 1.5–4.5)
Glucose: 109 mg/dL — ABNORMAL HIGH (ref 65–99)
Potassium: 5 mmol/L (ref 3.5–5.2)
Sodium: 140 mmol/L (ref 134–144)
Total Protein: 6.7 g/dL (ref 6.0–8.5)

## 2018-07-30 LAB — LIPID PANEL
Chol/HDL Ratio: 4.5 ratio — ABNORMAL HIGH (ref 0.0–4.4)
Cholesterol, Total: 185 mg/dL (ref 100–199)
HDL: 41 mg/dL
LDL Calculated: 105 mg/dL — ABNORMAL HIGH (ref 0–99)
Triglycerides: 196 mg/dL — ABNORMAL HIGH (ref 0–149)
VLDL Cholesterol Cal: 39 mg/dL (ref 5–40)

## 2018-08-06 ENCOUNTER — Telehealth: Payer: Self-pay | Admitting: *Deleted

## 2018-08-06 NOTE — Telephone Encounter (Signed)
-----   Message from Kerin Perna, NP sent at 07/30/2018  8:57 PM EDT ----- Start on statin recc for DM. A1C acceptable cont metformin

## 2018-08-06 NOTE — Telephone Encounter (Signed)
Medical Assistant used [employer] to contact patient.  Interpreter Name: Pamayotis  Interpreter #: [phone] Patient was not available, [employer] left patient a voicemail Voicemail states to give a call back to [place] with [facility] at [phone]. Please inform patients daughter that patient needs to start statin for cholesterol and to continue metformin.

## 2018-10-28 ENCOUNTER — Ambulatory Visit (INDEPENDENT_AMBULATORY_CARE_PROVIDER_SITE_OTHER): Payer: Self-pay | Admitting: Primary Care

## 2019-11-21 IMAGING — DX ABDOMEN - 1 VIEW
1 series · 1 of 1 positions shown · non-contrast
Comparison: Radiograph 07/19/2018

CLINICAL DATA: SBO

EXAM:
ABDOMEN - 1 VIEW

[abdomen]
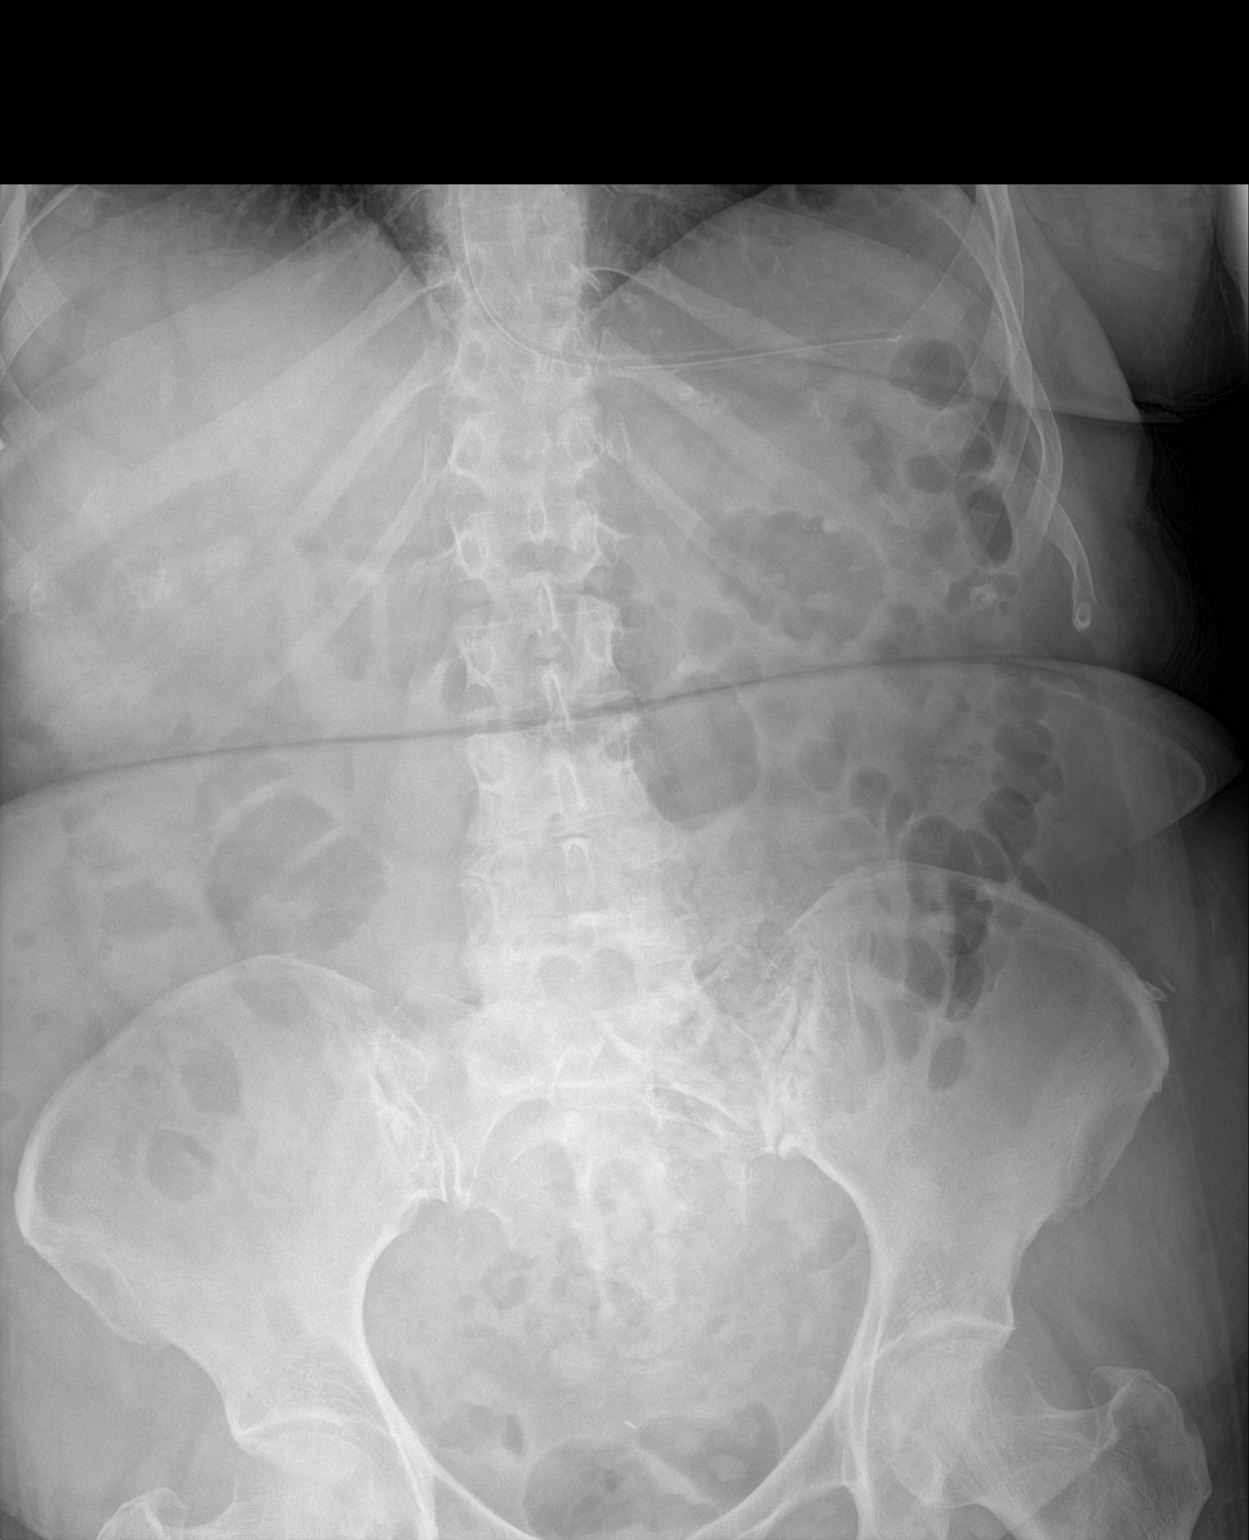

[1 of 1 positions shown; findings below may reference images not displayed]

FINDINGS: NG tube extends the stomach. No dilated large or small bowel.
Interval decrease in bowel distension seen on comparison exam. Gas
in the rectum
IMPRESSION: 1. Decrease in bowel distention. Gas in the rectum. Nonobstructive
bowel gas pattern.
2. NG tube in stomach.

## 2019-11-22 IMAGING — DX ABDOMEN - 1 VIEW
2 series · 2 of 2 positions shown · non-contrast
Comparison: Radiograph July 20, 2018.

CLINICAL DATA: Acute generalized abdominal pain and distention.

EXAM:
ABDOMEN - 1 VIEW

[abdomen kub (1 of 2)]
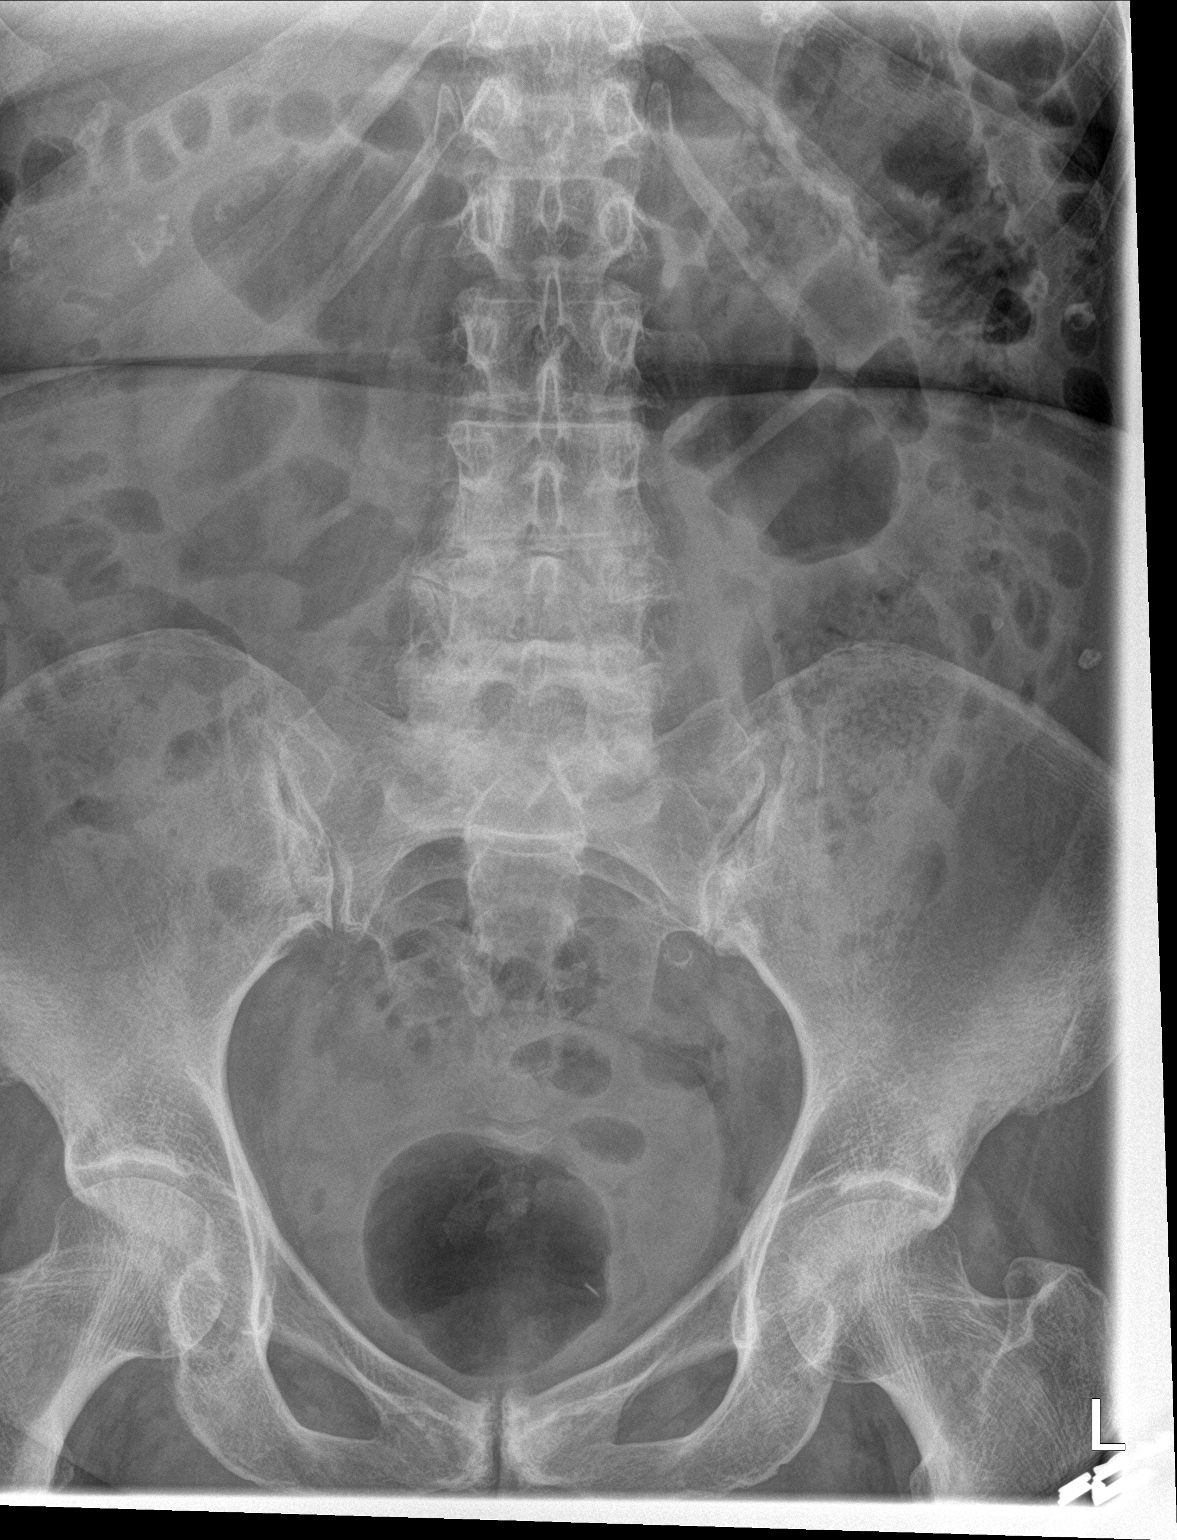

[abdomen kub (2 of 2)]
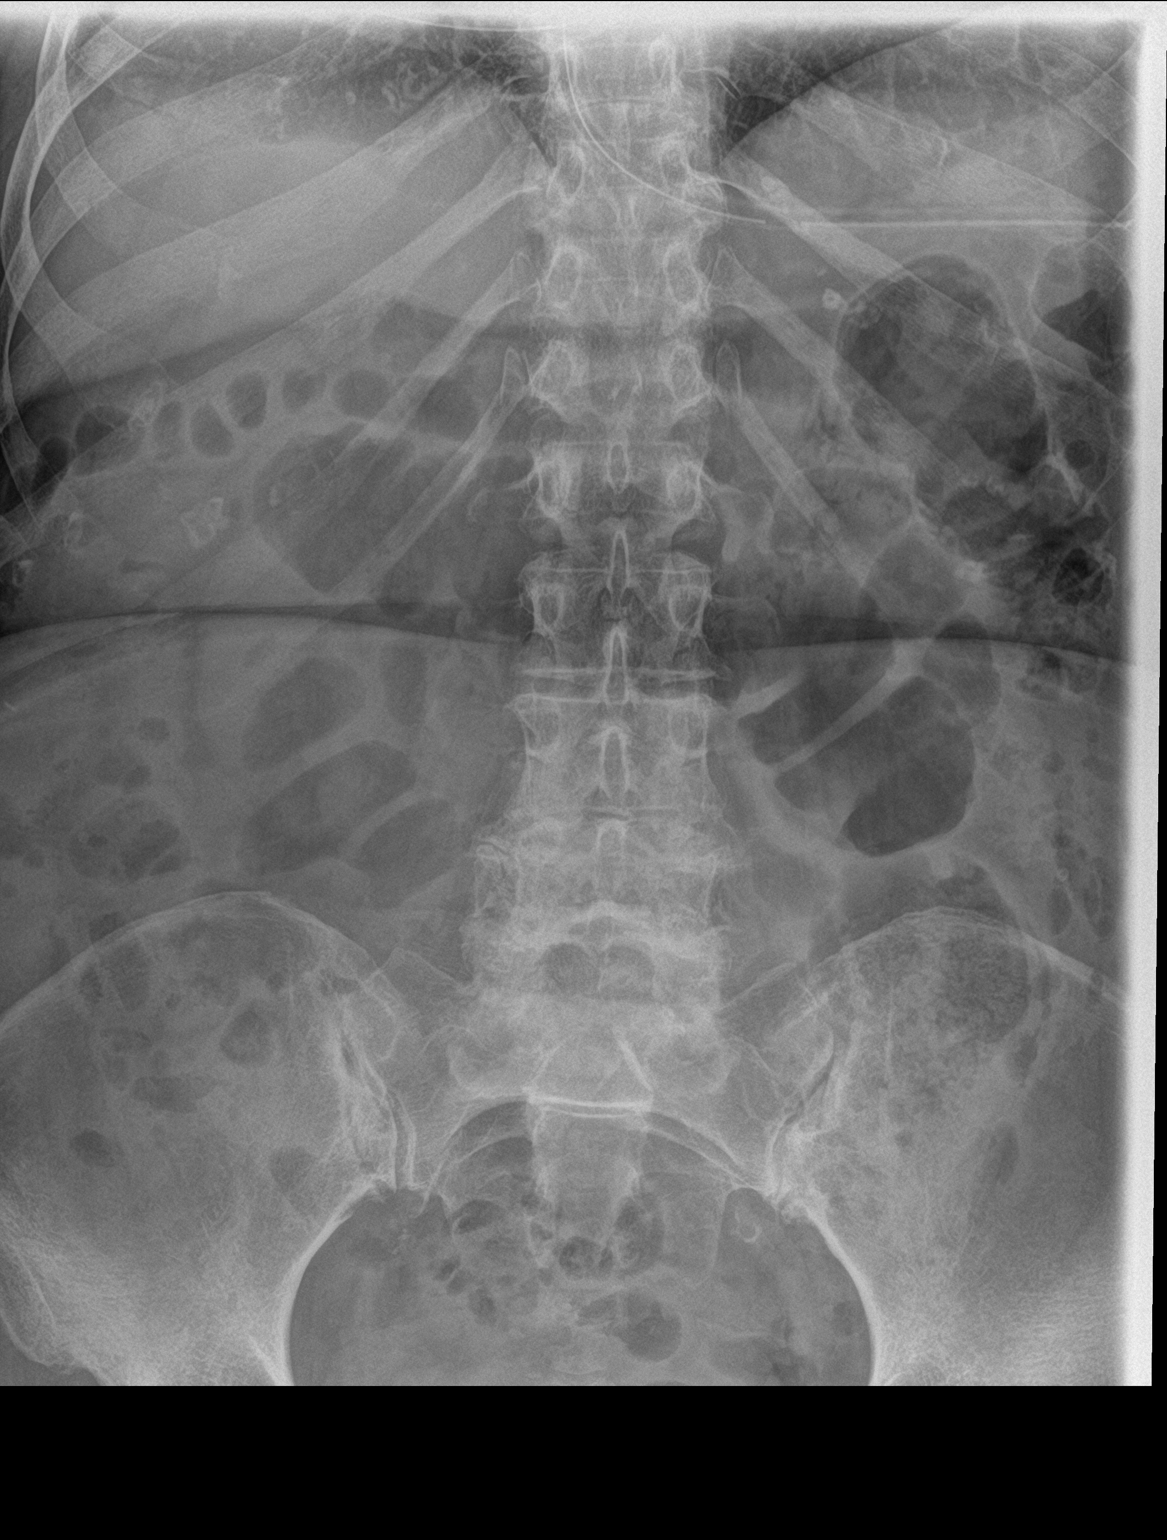

[2 of 2 positions shown; findings below may reference images not displayed]

FINDINGS: The bowel gas pattern is normal. Nasogastric tube tip is seen in
proximal stomach. No radio-opaque calculi or other significant
radiographic abnormality are seen.
IMPRESSION: No evidence of bowel obstruction or ileus.
